# Patient Record
Sex: Female | Born: 1995 | Race: White | Hispanic: No | Marital: Single | Smoking: Never smoker
Health system: Southern US, Community
[De-identification: ages and names within clinical notes are randomized; demographics above are authoritative.]

## PROBLEM LIST (undated history)

## (undated) DIAGNOSIS — F419 Anxiety disorder, unspecified: Secondary | ICD-10-CM

## (undated) DIAGNOSIS — F32A Depression, unspecified: Secondary | ICD-10-CM

## (undated) DIAGNOSIS — Z789 Other specified health status: Secondary | ICD-10-CM

## (undated) DIAGNOSIS — F329 Major depressive disorder, single episode, unspecified: Secondary | ICD-10-CM

## (undated) DIAGNOSIS — F4310 Post-traumatic stress disorder, unspecified: Secondary | ICD-10-CM

## (undated) DIAGNOSIS — H539 Unspecified visual disturbance: Secondary | ICD-10-CM

## (undated) HISTORY — DX: Major depressive disorder, single episode, unspecified: F32.9

## (undated) HISTORY — DX: Anxiety disorder, unspecified: F41.9

## (undated) HISTORY — DX: Post-traumatic stress disorder, unspecified: F43.10

## (undated) HISTORY — DX: Depression, unspecified: F32.A

---
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
## 2016-02-19 ENCOUNTER — Encounter (HOSPITAL_COMMUNITY): Payer: Self-pay | Admitting: Psychology

## 2016-03-01 ENCOUNTER — Other Ambulatory Visit (HOSPITAL_COMMUNITY): Payer: Self-pay | Admitting: Psychiatry

## 2016-03-01 DIAGNOSIS — F429 Obsessive-compulsive disorder, unspecified: Secondary | ICD-10-CM

## 2016-04-26 ENCOUNTER — Other Ambulatory Visit (HOSPITAL_COMMUNITY): Payer: Self-pay

## 2016-04-26 DIAGNOSIS — F4310 Post-traumatic stress disorder, unspecified: Secondary | ICD-10-CM

## 2016-04-26 DIAGNOSIS — F5102 Adjustment insomnia: Secondary | ICD-10-CM

## 2016-04-26 DIAGNOSIS — F429 Obsessive-compulsive disorder, unspecified: Secondary | ICD-10-CM

## 2016-04-26 DIAGNOSIS — F4010 Social phobia, unspecified: Secondary | ICD-10-CM

## 2016-04-26 DIAGNOSIS — F332 Major depressive disorder, recurrent severe without psychotic features: Secondary | ICD-10-CM

## 2016-04-26 DIAGNOSIS — F411 Generalized anxiety disorder: Secondary | ICD-10-CM

## 2016-04-26 MED ORDER — HYDROXYZINE HCL 10 MG PO TABS: 10.0000 mg | ORAL_TABLET | Freq: Three times a day (TID) | ORAL | 2 refills | Status: DC | PRN

## 2016-04-26 MED ORDER — FLUOXETINE HCL 40 MG PO CAPS: 80.0000 mg | ORAL_CAPSULE | Freq: Every day | ORAL | 2 refills | Status: DC

## 2016-06-01 ENCOUNTER — Inpatient Hospital Stay (HOSPITAL_COMMUNITY)
Admission: AD | Admit: 2016-06-01 | Discharge: 2016-06-07 | DRG: 885 | Disposition: A | Discharge status: Home / Self Care | Payer: PRIVATE HEALTH INSURANCE | Attending: Psychiatry | Admitting: Psychiatry

## 2016-06-01 ENCOUNTER — Ambulatory Visit (INDEPENDENT_AMBULATORY_CARE_PROVIDER_SITE_OTHER): Payer: PRIVATE HEALTH INSURANCE | Admitting: Psychiatry

## 2016-06-01 ENCOUNTER — Encounter (HOSPITAL_COMMUNITY): Payer: Self-pay | Admitting: *Deleted

## 2016-06-01 ENCOUNTER — Encounter (HOSPITAL_COMMUNITY): Payer: Self-pay | Admitting: Psychiatry

## 2016-06-01 DIAGNOSIS — Z91030 Bee allergy status: Secondary | ICD-10-CM

## 2016-06-01 DIAGNOSIS — F411 Generalized anxiety disorder: Secondary | ICD-10-CM | POA: Diagnosis present

## 2016-06-01 DIAGNOSIS — F332 Major depressive disorder, recurrent severe without psychotic features: Secondary | ICD-10-CM

## 2016-06-01 DIAGNOSIS — Z9889 Other specified postprocedural states: Secondary | ICD-10-CM | POA: Diagnosis not present

## 2016-06-01 DIAGNOSIS — Z825 Family history of asthma and other chronic lower respiratory diseases: Secondary | ICD-10-CM | POA: Diagnosis not present

## 2016-06-01 DIAGNOSIS — R45851 Suicidal ideations: Secondary | ICD-10-CM | POA: Diagnosis present

## 2016-06-01 DIAGNOSIS — Z811 Family history of alcohol abuse and dependence: Secondary | ICD-10-CM

## 2016-06-01 DIAGNOSIS — F329 Major depressive disorder, single episode, unspecified: Secondary | ICD-10-CM | POA: Diagnosis present

## 2016-06-01 DIAGNOSIS — F4310 Post-traumatic stress disorder, unspecified: Secondary | ICD-10-CM

## 2016-06-01 DIAGNOSIS — F429 Obsessive-compulsive disorder, unspecified: Secondary | ICD-10-CM | POA: Diagnosis not present

## 2016-06-01 DIAGNOSIS — Z818 Family history of other mental and behavioral disorders: Secondary | ICD-10-CM | POA: Diagnosis not present

## 2016-06-01 DIAGNOSIS — Z888 Allergy status to other drugs, medicaments and biological substances status: Secondary | ICD-10-CM

## 2016-06-01 DIAGNOSIS — Z8489 Family history of other specified conditions: Secondary | ICD-10-CM

## 2016-06-01 DIAGNOSIS — Z915 Personal history of self-harm: Secondary | ICD-10-CM

## 2016-06-01 DIAGNOSIS — Z79899 Other long term (current) drug therapy: Secondary | ICD-10-CM | POA: Diagnosis not present

## 2016-06-01 LAB — CBC
HCT: 33.9 % — ABNORMAL LOW (ref 36.0–46.0)
HEMOGLOBIN: 11.9 g/dL — AB (ref 12.0–15.0)
MCH: 29.8 pg (ref 26.0–34.0)
MCHC: 35.1 g/dL (ref 30.0–36.0)
MCV: 84.8 fL (ref 78.0–100.0)
PLATELETS: 296 10*3/uL (ref 150–400)
RBC: 4 MIL/uL (ref 3.87–5.11)
RDW: 12.4 % (ref 11.5–15.5)
WBC: 7.6 10*3/uL (ref 4.0–10.5)

## 2016-06-01 LAB — COMPREHENSIVE METABOLIC PANEL
ALBUMIN: 3.9 g/dL (ref 3.5–5.0)
ALT: 13 U/L — ABNORMAL LOW (ref 14–54)
ANION GAP: 7 (ref 5–15)
AST: 14 U/L — ABNORMAL LOW (ref 15–41)
Alkaline Phosphatase: 70 U/L (ref 38–126)
BUN: 15 mg/dL (ref 6–20)
CHLORIDE: 108 mmol/L (ref 101–111)
CO2: 26 mmol/L (ref 22–32)
Calcium: 9.2 mg/dL (ref 8.9–10.3)
Creatinine, Ser: 0.69 mg/dL (ref 0.44–1.00)
GFR calc non Af Amer: >60 mL/min (ref >60)
GLUCOSE: 119 mg/dL — AB (ref 65–99)
Potassium: 3.2 mmol/L — ABNORMAL LOW (ref 3.5–5.1)
SODIUM: 141 mmol/L (ref 135–145)
Total Bilirubin: 0.5 mg/dL (ref 0.3–1.2)
Total Protein: 7.3 g/dL (ref 6.5–8.1)

## 2016-06-01 LAB — RAPID URINE DRUG SCREEN, HOSP PERFORMED
AMPHETAMINES: NOT DETECTED
BARBITURATES: NOT DETECTED
BENZODIAZEPINES: NOT DETECTED
COCAINE: NOT DETECTED
OPIATES: NOT DETECTED
TETRAHYDROCANNABINOL: NOT DETECTED

## 2016-06-01 LAB — ETHANOL: Alcohol, Ethyl (B): <5 mg/dL (ref <5)

## 2016-06-01 LAB — PREGNANCY, URINE: Preg Test, Ur: NEGATIVE

## 2016-06-01 MED ORDER — INFLUENZA VAC SPLIT QUAD 0.5 ML IM SUSY
0.5000 mL | PREFILLED_SYRINGE | INTRAMUSCULAR | Status: AC
  Administered 2016-06-02: 0.5 mL via INTRAMUSCULAR
  Filled 2016-06-01: qty 0.5

## 2016-06-01 MED ORDER — FLUOXETINE HCL 20 MG PO CAPS
80.0000 mg | ORAL_CAPSULE | Freq: Every day | ORAL | Status: DC
  Filled 2016-06-01 (×2): qty 4

## 2016-06-01 MED ORDER — MAGNESIUM HYDROXIDE 400 MG/5ML PO SUSP
30.0000 mL | Freq: Every day | ORAL | Status: DC | PRN
  Administered 2016-06-06: 30 mL via ORAL
  Filled 2016-06-01: qty 30

## 2016-06-01 MED ORDER — TRAZODONE HCL 50 MG PO TABS
50.0000 mg | ORAL_TABLET | Freq: Every evening | ORAL | Status: DC | PRN
  Administered 2016-06-01 – 2016-06-06 (×2): 50 mg via ORAL
  Filled 2016-06-01 (×2): qty 1
  Filled 2016-06-01: qty 7
  Filled 2016-06-01 (×2): qty 1

## 2016-06-01 MED ORDER — HYDROXYZINE HCL 10 MG PO TABS: 10.0000 mg | ORAL_TABLET | Freq: Three times a day (TID) | ORAL | Status: DC | PRN

## 2016-06-01 MED ORDER — VENLAFAXINE HCL ER 37.5 MG PO CP24
37.5000 mg | ORAL_CAPSULE | Freq: Every day | ORAL | Status: DC
  Administered 2016-06-02 – 2016-06-03 (×2): 37.5 mg via ORAL
  Filled 2016-06-01 (×4): qty 1

## 2016-06-01 MED ORDER — HYDROXYZINE HCL 10 MG PO TABS
10.0000 mg | ORAL_TABLET | Freq: Four times a day (QID) | ORAL | Status: DC | PRN
  Administered 2016-06-01: 10 mg via ORAL
  Filled 2016-06-01 (×2): qty 1
  Filled 2016-06-01: qty 10
  Filled 2016-06-01: qty 1

## 2016-06-01 MED ORDER — ALUM & MAG HYDROXIDE-SIMETH 200-200-20 MG/5ML PO SUSP: 30.0000 mL | ORAL | Status: DC | PRN

## 2016-06-01 MED ORDER — ACETAMINOPHEN 325 MG PO TABS: 650.0000 mg | ORAL_TABLET | Freq: Four times a day (QID) | ORAL | Status: DC | PRN

## 2016-06-01 NOTE — Tx Team (Signed)
Initial Treatment Plan 06/01/2016 12:00 PM [facility] WUJ:[id number]    PATIENT STRESSORS: Marital or family conflict Traumatic event   PATIENT STRENGTHS: Average or above average intelligence Communication skills General fund of knowledge Physical Health Supportive family/friends   PATIENT IDENTIFIED PROBLEMS: Depression  Anxiety  "I feel overwhelmed with school"  "I had a bad conversation with a friend"  Suicidal Ideation             DISCHARGE CRITERIA:  Improved stabilization in mood, thinking, and/or behavior Motivation to continue treatment in a less acute level of care Reduction of life-threatening or endangering symptoms to within safe limits Verbal commitment to aftercare and medication compliance  PRELIMINARY DISCHARGE PLAN: Outpatient therapy Return to previous living arrangement  PATIENT/FAMILY INVOLVEMENT: This treatment plan has been presented to and reviewed with the patient, Jo Haas, and/or family member.  The patient and family have been given the opportunity to ask questions and make suggestions.  Cranford MonBeaudry, Florie Carico Evans, RN 06/01/2016, 12:00 PM

## 2016-06-01 NOTE — Progress Notes (Signed)
Admission note:  Patient is a 20 yo female who was referred from outpatient treatment for depression and suicidal ideation.  Patient was seeing her psychiatrist, Dr. Landis GandyArgawal.  Patient states, "I have had depression since I was 20 years old.  I had a bad conversation with a friend last night.  It did not go well."  Patient stated that her friend did not want to see her anymore and it triggered her suicidal thoughts.  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  Patient has been compliant with her psychiatric medications.  She denies any drug or alcohol use.  She is not a smoker.  Patient does not have any pertinent medical hx.  Skin search revealed an abdominal scar from gallbladder surgery and a nose piercing.  [facility].  Patient was oriented to room and unit.

## 2016-06-01 NOTE — [facility] Suicide Risk Assessment (Signed)
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 MDD (major depressive disorder), recurrent episode, severe (HCC) [F33.2] 06/01/2016  . GAD (generalized anxiety disorder) [F41.1] 04/10/2015  . Major depressive disorder, recurrent, severe without psychotic features (HCC) [F33.2] 04/10/2015  . PTSD (post-traumatic stress disorder) [F43.10] 04/10/2015  . Social anxiety disorder [F40.10] 04/10/2015  . OCD (obsessive compulsive disorder) [F42.9] 04/10/2015  . Routine general medical examination at a health care facility [Z00.00] 04/10/2015  . Generalized anxiety disorder [F41.1] 02/08/2014  . Chronic cholecystitis with calculus [K80.10] 01/23/2014  . Major depressive disorder, recurrent episode (HCC) [F33.9] 10/27/2011     Continued Clinical Symptoms:  Alcohol Use Disorder Identification Test Final Score (AUDIT): 0 The "Alcohol Use Disorders Identification Test", Guidelines for Use in Primary Care, Second Edition.  World [employer]Health [employer] [facility](WHO). Score between 0-7:  no or low risk or alcohol related problems. Score between 8-15:  moderate risk of alcohol related problems. Score between 16-19:  high risk of alcohol related problems. Score 20 or above:  warrants further diagnostic evaluation for alcohol dependence and treatment.   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     Psychiatric Specialty Exam: Physical Exam  ROS  Blood pressure (!) 102/56, pulse 91, temperature 98.6 F (37 C), temperature source Oral, resp. rate 18, height 5\' 1"  (1.549 m), weight 135 lb 8 oz (61.5 kg), SpO2 99 %.Body mass index is 25.6 kg/m.   see admit note MSE    COGNITIVE FEATURES THAT CONTRIBUTE TO RISK:  Closed-mindedness and Loss of executive function    SUICIDE RISK:   Moderate:  Frequent suicidal ideation with limited intensity, and duration, some specificity in terms of plans, no associated intent, good self-control, limited dysphoria/symptomatology, some risk factors present, and identifiable protective factors, including available and accessible social support.   PLAN OF CARE: Patient will be admitted to inpatient psychiatric unit for stabilization and safety. Will provide and encourage milieu participation. Provide medication management and maked adjustments as needed.  Will follow daily.    I certify that inpatient services furnished can reasonably be expected to improve the patient's condition.  Nehemiah MassedOBOS, Jo Carreras, MD 06/01/2016, 5:44 PM
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
## 2016-06-01 NOTE — Progress Notes (Signed)
Recreation Therapy Notes  Animal-Assisted Activity (AAA) Program Checklist/Progress Notes Patient Eligibility Criteria Checklist & Daily Group note for Rec TxIntervention  Date: 11.07.2017 Time: 2:45pm Location: [address] Dayroom    AAA/T Program Assumption of Risk Form signed by Patient/ or Parent Legal Guardian Yes  Patient is free of allergies or sever asthma Yes  Patient reports no fear of animals Yes  Patient reports no history of cruelty to animals Yes  Patient understands his/her participation is voluntary Yes  Patient washes hands before animal contact Yes  Patient washes hands after animal contact Yes  Behavioral Response: Engaged, Attentive.   Education:Hand Washing, Appropriate Animal Interaction   Education Outcome: Acknowledges education.   Clinical Observations/Feedback: Patient attended session and interacted appropriately with therapy dog and peers. Patient asked appropriate questions about therapy dog and his training. Patient shared stories about their pets at home with group.   Salvator Seppala L Meta Kroenke, LRT/CTRS        Jo Haas L 06/01/2016 3:03 PM 

## 2016-06-01 NOTE — [facility] Group Notes (Signed)
[facility] LCSW Group Therapy 06/01/2016 1:15 PM  Type of Therapy: Group Therapy- Feelings about Diagnosis  Participation Level: Active   Participation Quality:  Reserved  Affect:  Flat  Cognitive: Alert and Oriented   Insight:  Developing   Engagement in Therapy: Developing/Improving and Engaged   Modes of Intervention: Clarification, Confrontation, Discussion, Education, Exploration, Limit-setting, Orientation, Problem-solving, Rapport Building, Dance movement psychotherapisteality Testing, Socialization and Support  Description of Group:   This group will allow patients to explore their thoughts and feelings about diagnoses they have received. Patients will be guided to explore their level of understanding and acceptance of these diagnoses. Facilitator will encourage patients to process their thoughts and feelings about the reactions of others to their diagnosis, and will guide patients in identifying ways to discuss their diagnosis with significant others in their lives. This group will be process-oriented, with patients participating in exploration of their own experiences as well as giving and receiving support and challenge from other group members.  Summary of Progress/Problems:  Pt reports that she at times feels alone in her depression due to the stigma associated with mental illness. Pt interacted well with peers and was able to process her experience with mental illness and expressed hope to find ways to cope more effectively.  Therapeutic Modalities:   Cognitive Behavioral Therapy Solution Focused Therapy Motivational Interviewing Relapse Prevention Therapy  Vernie ShanksLauren Auron Tadros, LCSW 06/01/2016 2:39 PM

## 2016-06-01 NOTE — H&P (Signed)
Psychiatric Admission Assessment Adult  Patient Identification: Jo Haas MRN:  [id number] Date of Evaluation:  06/01/2016 Chief Complaint:  " I have been feeling more depressed " Principal Diagnosis:  Major Depression, Recurrent, Severe Diagnosis:   Patient Active Problem List   Diagnosis Date Noted  . MDD (major depressive disorder), recurrent episode, severe (HCC) [F33.2] 06/01/2016  . GAD (generalized anxiety disorder) [F41.1] 04/10/2015  . Major depressive disorder, recurrent, severe without psychotic features (HCC) [F33.2] 04/10/2015  . PTSD (post-traumatic stress disorder) [F43.10] 04/10/2015  . Social anxiety disorder [F40.10] 04/10/2015  . OCD (obsessive compulsive disorder) [F42.9] 04/10/2015  . Routine general medical examination at a health care facility [Z00.00] 04/10/2015  . Generalized anxiety disorder [F41.1] 02/08/2014  . Chronic cholecystitis with calculus [K80.10] 01/23/2014  . Major depressive disorder, recurrent episode (HCC) [F33.9] 10/27/2011   History of Present Illness: 20 year old female , who has a history of chronic depression. She feels her depression has been particularly severe over recent weeks to months and describes herself as being " stuck " in her depression. States that she felt very upset about a recent conversation with a friend , where she felt " she did not care than much about what is going on with me ". After this she developed suicidal ideations, with thoughts of ingesting some chemical from the kitchen, but did not attempt to hurt self .   States that in addition to depression, has significant anxiety symptoms, but states that while mood symptoms have worsened, her anxiety symptoms have tended to slowly improve, particularly after she stopped living with her father about a year ago.  She had a scheduled appointment with her outpatient psychiatrist ( Dr. Caryl Never ), and told her about worsening mood and SI, and was encouraged to come in to  hospital. . Associated Signs/Symptoms: Depression Symptoms:  depressed mood, anhedonia, insomnia, recurrent thoughts of death, suicidal thoughts with specific plan, loss of energy/fatigue, low sense of self esteem (Hypo) Manic Symptoms:  Denies  Anxiety Symptoms:  Describes excessive worrying ,  occasional panic attacks , some agoraphobia, describes some social anxiety , but feels this has improved overtime .  Psychotic Symptoms:   Denies  PTSD Symptoms: Reports some PTSD type symptoms, to include intrusive memories, nightmares, avoidance, related to father being verbally abusive, angry, but denies physical abuse .  Total Time spent with patient: 45 minutes  Past Psychiatric History:  This is her first psychiatric admission, states one prior suicide attempt by  Overdosing on opiates - which had been prescribed 2-3 years ago because of gall bladder pain . History of self cutting, which she states was occasional, states last time was 5 years ago. Denies any history of psychosis, denies any clear history of mania or hypomania, but describes brief periods of increased energy at times, which last only minutes to hours . Stresses depression, which she describes as chronic, and anxiety, as her major symptoms. Denies any history of violence .  Is the patient at risk to self? Yes.    Has the patient been a risk to self in the past 6 months? No.  Has the patient been a risk to self within the distant past? No.  Is the patient a risk to others? No.  Has the patient been a risk to others in the past 6 months? No.  Has the patient been a risk to others within the distant past? No.   Prior Inpatient Therapy: Prior Inpatient Therapy: No Prior Outpatient Therapy: Prior Outpatient Therapy:  No Does patient have an ACCT team?: No Does patient have Intensive In-House Services?  : No Does patient have [facility] services? : No Does patient have P4CC services?: No  Alcohol Screening: 1. How often do you have  a drink containing alcohol?: Never 9. Have you or someone else been injured as a result of your drinking?: No 10. Has a relative or friend or a doctor or another health worker been concerned about your drinking or suggested you cut down?: No Alcohol Use Disorder Identification Test Final Score (AUDIT): 0 Brief Intervention: AUDIT score less than 7 or less-screening does not suggest unhealthy drinking-brief intervention not indicated Substance Abuse History in the last 12 months:  Denies alcohol abuse, denies drug abuse  Consequences of Substance Abuse: denies  Previous Psychotropic Medications:  Has been on Prozac for years, currently on 80 mgrs daily, and on Atarax PRNs. Remembers having been on Lexapro, but gained significant weight .  Psychological Evaluations:  No  Past Medical History:  Denies any medical illnesses, does not smoke , denies pregnancy .  Past Medical History:  Diagnosis Date  . Anxiety   . Depression   . Multiple body piercings    nose, ears  . PTSD (post-traumatic stress disorder)   . Vision abnormalities    corrective lenses    Past Surgical History:  Procedure Laterality Date  . LAPAROSCOPIC CHOLECYSTECTOMY SINGLE PORT N/A 03/04/2014   Procedure: LAPAROSCOPIC CHOLECYSTECTOMY SINGLE PORT;  Surgeon: Ardeth SportsmanSteven C. Gross, MD;  Location: [facility];  Service: General;  Laterality: N/A;   Family History:  Parents alive, divorced, has one brother. Currently living with mother, states he has a difficult relationship with father. Family History  Problem Relation Age of Onset  . Depression Mother   . Varicose Veins Mother   . Asthma Brother   . Alcohol abuse Father   . Anxiety disorder Father   . COPD Paternal Grandmother    Family Psychiatric  History: Mother has history of depression, father has history of anxiety, and has had psychotic decompensations in the past , states father has alcohol abuse history, denies history of suicides in family  Tobacco Screening: Have you  used any form of tobacco in the last 30 days? 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 Not employed . Denies any legal issues . History  Alcohol Use No     History  Drug Use No    Additional Social History: Marital status: Single    Pain Medications: denies Prescriptions: Prozac; Atarax Over the Counter: denies History of alcohol / drug use?: No history of alcohol / drug abuse  Allergies:   Allergies  Allergen Reactions  . Amoxicillin Swelling    .Marland Kitchen.Has patient had a PCN reaction causing immediate rash, facial/tongue/throat swelling, SOB or lightheadedness with hypotension: UNKNOWN Has patient had a PCN reaction causing severe rash involving mucus membranes or skin necrosis: UNKNOWN Has patient had a PCN reaction that required hospitalization UNKNOWN Has patient had a PCN reaction occurring within the last 10 years: Yes - infant allergy If all of the above answers are "NO", then may proceed with Cephalosporin use.   . Pollen Extract     Food containing pollen   Lab Results: No results found for this or any previous visit (from the past 48 hour(s)).  Blood Alcohol level:  No results found for: [facility]  Metabolic Disorder Labs:  No results found for: HGBA1C, MPG No results found for: PROLACTIN No results found for: CHOL, TRIG, HDL, CHOLHDL, VLDL, LDLCALC  Current Medications: Current Facility-Administered Medications  Medication Dose Route Frequency Provider Last Rate Last Dose  . acetaminophen (TYLENOL) tablet 650 mg  650 mg Oral Q6H PRN Thermon Leyland, NP      . alum & mag hydroxide-simeth (MAALOX/MYLANTA) 200-200-20 MG/5ML suspension 30 mL  30 mL Oral Q4H PRN Thermon Leyland, NP      . Melene Muller ON 06/02/2016] FLUoxetine (PROZAC) capsule 80 mg  80 mg  Oral Daily Thermon Leyland, NP      . hydrOXYzine (ATARAX/VISTARIL) tablet 10 mg  10 mg Oral TID PRN Thermon Leyland, NP      . Melene Muller ON 06/02/2016] Influenza vac split quadrivalent PF (FLUARIX) injection 0.5 mL  0.5 mL Intramuscular Tomorrow-1000 Loye Vento A Tyray Proch, MD      . magnesium hydroxide (MILK OF MAGNESIA) suspension 30 mL  30 mL Oral Daily PRN Thermon Leyland, NP      . traZODone (DESYREL) tablet 50 mg  50 mg Oral QHS PRN Thermon Leyland, NP       PTA Medications: Prescriptions Prior to Admission  Medication Sig Dispense Refill Last Dose  . FLUoxetine (PROZAC) 40 MG capsule Take 2 capsules (80 mg total) by mouth daily. 60 capsule 2 05/31/2016 at Unknown time  . hydrOXYzine (ATARAX/VISTARIL) 10 MG tablet Take 1 tablet (10 mg total) by mouth 3 (three) times daily as needed for anxiety (anxiety). 90 tablet 2 Past Month at Unknown time  . drospirenone-ethinyl estradiol (YAZ,GIANVI,LORYNA) 3-0.02 MG tablet Take 1 tablet by mouth daily. (Patient not taking: Reported on 06/01/2016) 3 Package 0 Not Taking at Unknown time    Musculoskeletal: Strength & Muscle Tone: within normal limits Gait & Station: normal Patient leans: N/A  Psychiatric Specialty Exam: Physical Exam  Review of Systems  Constitutional: Negative.   HENT: Negative.   Eyes: Negative.   Respiratory: Negative.   Cardiovascular: Negative.   Gastrointestinal: Positive for diarrhea.       Reports frequent diarrhea, especially with certain foods  Genitourinary: Negative.   Musculoskeletal: Negative.   Skin: Negative.   Neurological: Negative for seizures.  Endo/Heme/Allergies: Negative.   Psychiatric/Behavioral: Positive for depression and suicidal ideas. Negative for hallucinations and substance abuse. The patient is nervous/anxious.   All other systems reviewed and are negative.   Blood pressure (!) 102/56, pulse 91, temperature 98.6 F (37 C), temperature source Oral, resp. rate 18, height 5\' 1"  (1.549 m), weight 135 lb 8 oz  (61.5 kg), SpO2 99 %.Body mass index is 25.6 kg/m.  General Appearance: Fairly Groomed  Eye Contact:  Fair  Speech:  Normal Rate  Volume:  Decreased  Mood:  Depressed  Affect:  constricted, but reactive, and smiles briefly at times   Thought Process:  Linear  Orientation:  Full (Time, Place, and Person)  Thought Content:  denies hallucinations ,  no delusions , not internally preoccupied   Suicidal Thoughts:  No denies any current suicidal ideations or any self injurious ideations, and contracts for safety on unit   Homicidal Thoughts:  No denies any homicidal or violent ideations   Memory:  recent and remote grossly intact   Judgement:  Improving   Insight:  Fair  Psychomotor Activity:  Normal  Concentration:  Concentration: Good and Attention Span: Good  Recall:  Good  Fund of Knowledge:  Good  Language:  Good  Akathisia:  Negative  Handed:  Right  AIMS (if indicated):     Assets:  Desire for Improvement Resilience  ADL's:  Intact  Cognition:  WNL  Sleep:       Treatment Plan Summary: Daily contact with patient to assess and evaluate symptoms and progress in treatment, Medication management, Plan inpatient admission and medications as below   Observation Level/Precautions:  15 minute checks  Laboratory:  as needed   Psychotherapy:  Milieu, support, groups   Medications:  We discussed options- patient states that she has felt increasingly depressed in spite of increased Prozac dose- agrees to a new antidepressant trial - [facility].5 mgrs QAM . Trazodone and Vistaril PRN s as needed for insomnia or anxiety   Consultations:  As needed   Discharge Concerns:  -   Estimated LOS: 5 days   Other:     Physician Treatment Plan for Primary Diagnosis:  Major Depression, Recurrent, Severe, No Psychotic Features  Long Term Goal(s): Improvement in symptoms so as ready for discharge  Short Term Goals: Ability to verbalize feelings will improve, Ability to disclose and  discuss suicidal ideas and Ability to identify and develop effective coping behaviors will improve  Physician Treatment Plan for Secondary Diagnosis: Active Problems:   MDD (major depressive disorder), recurrent episode, severe (HCC)  Long Term Goal(s): Improvement in symptoms so as ready for discharge  Short Term Goals: Ability to verbalize feelings will improve, Ability to disclose and discuss suicidal ideas and Ability to identify and develop effective coping behaviors will improve  I certify that inpatient services furnished can reasonably be expected to improve the patient's condition.    Nehemiah MassedOBOS, Ellason Segar, MD 11/7/20175:07 PM

## 2016-06-01 NOTE — BH Assessment (Signed)
Assessment Note  Jo Haas is a 20 y.o. female who [facility] after an appt with her psychiatrist, Dr. Landis GandyArgawal. Pt admits to having depression "for a while" and, after a friend told her yesterday that they didn't want to see her anymore, she became more depressed and began to have SI. Pt reports a plan to OD or drink something from her kitchen. Pt intended to carry out the plan today. Pt reports being medication compliant and not having a major medication change (aside from increase of Prozac) for @ 3 years. Pt denies HI, AVH.    Diagnosis: MDD, recurrent episode, severe; GAD  Past Medical History:  Past Medical History:  Diagnosis Date  . Anxiety   . Depression   . Multiple body piercings    nose, ears  . PTSD (post-traumatic stress disorder)   . Vision abnormalities    corrective lenses    Past Surgical History:  Procedure Laterality Date  . LAPAROSCOPIC CHOLECYSTECTOMY SINGLE PORT N/A 03/04/2014   Procedure: LAPAROSCOPIC CHOLECYSTECTOMY SINGLE PORT;  Surgeon: Ardeth SportsmanSteven C. Gross, MD;  Location: [facility];  Service: General;  Laterality: N/A;    Family History:  Family History  Problem Relation Age of Onset  . Depression Mother   . Varicose Veins Mother   . Asthma Brother   . Alcohol abuse Father   . Anxiety disorder Father   . COPD Paternal Grandmother     Social History:  reports that she has never smoked. She has never used smokeless tobacco. She reports that she does not drink alcohol or use drugs.  Additional Social History:  Alcohol / Drug Use Pain Medications: denies Prescriptions: Prozac; Atarax Over the Counter: denies History of alcohol / drug use?: No history of alcohol / drug abuse  CIWA: CIWA-Ar BP: 102/65 Pulse Rate: 86 COWS:    Allergies:  Allergies  Allergen Reactions  . Amoxicillin Swelling  . Pollen Extract     Food containing pollen    Home Medications:  (Not in a hospital admission)  OB/GYN Status:  No  LMP recorded.  General Assessment Data [facility] TTS Assessment: In system Is this a Tele or Face-to-Face Assessment?: Face-to-Face Is this an Initial Assessment or a Re-assessment for this encounter?: Initial Assessment Marital status: Single Is patient pregnant?: Unknown Pregnancy Status: Unknown Living Arrangements: Parent, Other relatives Can pt return to current living arrangement?: Yes Admission Status: Voluntary Is patient capable of signing voluntary admission?: Yes Referral Source: Self/Family/Friend  Medical Screening Exam [facility] Walk[facility] ONLY) Medical Exam completed: Yes  Crisis Care Plan Living Arrangements: Parent, Other relatives Name of Psychiatrist: Dr. Landis GandyArgawal [facility]) Name of Therapist: Geoffery SpruceLeann [facility])  Education Status Is patient currently in school?: Yes Name of school: [employer]  Risk to self with the past 6 months Suicidal Ideation: Yes-Currently Present Has patient been a risk to self within the past 6 months prior to admission? : No Suicidal Intent: Yes-Currently Present Has patient had any suicidal intent within the past 6 months prior to admission? : No Is patient at risk for suicide?: Yes Suicidal Plan?: Yes-Currently Present Has patient had any suicidal plan within the past 6 months prior to admission? : No Specify Current Suicidal Plan: OD or drink something from the kitchen (i.e. bleach) Access to Means: Yes Specify Access to Suicidal Means: has medications and things from kitchen What has been your use of drugs/alcohol within the last 12 months?: pt denies Previous Attempts/Gestures: No Intentional  Self Injurious Behavior: None Family Suicide History: No Recent stressful life event(s): Other (Comment) (see narrative) Persecutory voices/beliefs?: No Depression: Yes Depression Symptoms: Feeling worthless/self pity, Loss of interest in usual pleasures, Isolating, Fatigue Substance abuse history and/or treatment for  substance abuse?: No Suicide prevention information given to non-admitted patients: Not applicable  Risk to Others within the past 6 months Homicidal Ideation: No Does patient have any lifetime risk of violence toward others beyond the six months prior to admission? : No Thoughts of Harm to Others: No Current Homicidal Intent: No Current Homicidal Plan: No Access to Homicidal Means: No History of harm to others?: No Assessment of Violence: None Noted Does patient have access to weapons?: No Criminal Charges Pending?: No Does patient have a court date: No Is patient on probation?: No  Psychosis Hallucinations: None noted Delusions: None noted  Mental Status Report Appearance/Hygiene: Unremarkable Eye Contact: Fair Motor Activity: Unremarkable Speech: Logical/coherent, Soft Level of Consciousness: Quiet/awake Mood: Depressed, Anxious Affect: Appropriate to circumstance Anxiety Level: Panic Attacks Panic attack frequency: 2 attacks per week Thought Processes: Coherent, Relevant Judgement: Impaired Orientation: Place, Person, Time, Appropriate for developmental age, Situation Obsessive Compulsive Thoughts/Behaviors: Unable to Assess  Cognitive Functioning Concentration: Normal Memory: Recent Intact, Remote Intact IQ: Average Insight: see judgement above Impulse Control: Unable to Assess Appetite: Good Sleep: No Change Total Hours of Sleep: 7 Vegetative Symptoms: Staying in bed  ADLScreening [facility] Assessment Services) Patient's cognitive ability adequate to safely complete daily activities?: Yes Patient able to express need for assistance with ADLs?: Yes Independently performs ADLs?: Yes (appropriate for developmental age)  Prior Inpatient Therapy Prior Inpatient Therapy: No  Prior Outpatient Therapy Prior Outpatient Therapy: No Does patient have an ACCT team?: No Does patient have Intensive In-House Services?  : No Does patient have [facility] services? : No Does  patient have P4CC services?: No  ADL Screening (condition at time of admission) Patient's cognitive ability adequate to safely complete daily activities?: Yes Is the patient deaf or have difficulty hearing?: No Does the patient have difficulty seeing, even when wearing glasses/contacts?: No Does the patient have difficulty concentrating, remembering, or making decisions?: No Patient able to express need for assistance with ADLs?: Yes Does the patient have difficulty dressing or bathing?: No Independently performs ADLs?: Yes (appropriate for developmental age) Does the patient have difficulty walking or climbing stairs?: No Weakness of Legs: None Weakness of Arms/Hands: None  Home Assistive Devices/Equipment Home Assistive Devices/Equipment: None  Therapy Consults (therapy consults require a physician order) PT Evaluation Needed: No OT Evalulation Needed: No SLP Evaluation Needed: No Abuse/Neglect Assessment (Assessment to be complete while patient is alone) Physical Abuse: Denies Verbal Abuse: Yes, past (Comment) Sexual Abuse: Denies Exploitation of patient/patient's resources: Denies Self-Neglect: Denies Values / Beliefs Cultural Requests During Hospitalization: None Spiritual Requests During Hospitalization: None Consults Spiritual Care Consult Needed: No Social Work Consult Needed: No Merchant navy officerAdvance Directives (For Healthcare) Does patient have an advance directive?: No Would patient like information on creating an advanced directive?: No - patient declined information    Additional Information 1:1 In Past 12 Months?: No CIRT Risk: No Elopement Risk: No Does patient have medical clearance?: Yes     Disposition:  Disposition Initial Assessment Completed for this Encounter: Yes (consulted with Fransisca KaufmannLaura Davis, PMHNP) Disposition of Patient: Inpatient treatment program Type of inpatient treatment program: Adult [facility]  On Site Evaluation by:   Reviewed with  Physician:    Laddie AquasSamantha M Kaia Depaolis 06/01/2016 10:27 AM

## 2016-06-01 NOTE — H&P (Signed)
Behavioral Health Medical Screening Exam  Jo Haas is an 20 y.o. female who [facility] due to expressing active suicidal ideation to Dr. Kenna GilbertArgarwal during his appointment this morning. Patient has history of prior overdose. Jo Haas denies having acted on her suicidal thoughts stating "I planned on drinking bleach after class this evening. Maybe taking some of my other medications too. I have been more depressed since the summer started." Patient has been accepted to [facility] for voluntary inpatient psychiatric admission.   Total Time spent with patient: 20 minutes  Psychiatric Specialty Exam: Physical Exam  Constitutional: She is oriented to person, place, and time. She appears well-developed and well-nourished.  HENT:  Head: Normocephalic and atraumatic.  Right Ear: External ear normal.  Left Ear: External ear normal.  Neck: Normal range of motion.  Cardiovascular: Normal rate, regular rhythm, normal heart sounds and intact distal pulses.   Respiratory: Effort normal and breath sounds normal.  GI: Soft. Bowel sounds are normal.  Musculoskeletal: Normal range of motion.  Neurological: She is alert and oriented to person, place, and time.  Skin: Skin is warm and dry.    Review of Systems  Constitutional: Negative for chills, fever and weight loss.  Eyes: Negative for blurred vision and double vision.  Respiratory: Negative for cough, shortness of breath and wheezing.   Cardiovascular: Negative for chest pain, palpitations and leg swelling.  Gastrointestinal: Negative for abdominal pain, diarrhea, heartburn, nausea and vomiting.  Musculoskeletal: Negative for myalgias and neck pain.  Skin: Negative for itching and rash.  Neurological: Negative for dizziness, tingling and headaches.  Psychiatric/Behavioral: Positive for depression and suicidal ideas. The patient is nervous/anxious.     Blood pressure 102/65, pulse 86, resp. rate 18.There is  no height or weight on file to calculate BMI.  General Appearance: Casual  Eye Contact:  Fair  Speech:  Clear and Coherent  Volume:  Decreased  Mood:  Dysphoric  Affect:  Constricted  Thought Process:  Coherent  Orientation:  Full (Time, Place, and Person)  Thought Content:  Worsening depression   Suicidal Thoughts:  Yes.  with intent/plan  Homicidal Thoughts:  No  Memory:  Immediate;   Good Recent;   Good Remote;   Good  Judgement:  Fair  Insight:  Present  Psychomotor Activity:  Decreased  Concentration: Concentration: Good and Attention Span: Fair  Recall:  Good  Fund of Knowledge:Good  Language: Good  Akathisia:  No  Handed:  Right  AIMS (if indicated):     Assets:  Communication Skills Desire for Improvement Financial Resources/Insurance Housing Intimacy Leisure Time Resilience Vocational/Educational  Sleep:       Musculoskeletal: Strength & Muscle Tone: within normal limits Gait & Station: normal Patient leans: N/A  Blood pressure 102/65, pulse 86, resp. rate 18.  Recommendations:  Based on my evaluation the patient does not appear to have an emergency medical condition.  Fransisca KaufmannAVIS, Deo Mehringer, NP 06/01/2016, 10:27 AM

## 2016-06-02 DIAGNOSIS — Z818 Family history of other mental and behavioral disorders: Secondary | ICD-10-CM

## 2016-06-02 DIAGNOSIS — Z811 Family history of alcohol abuse and dependence: Secondary | ICD-10-CM

## 2016-06-02 DIAGNOSIS — F332 Major depressive disorder, recurrent severe without psychotic features: Principal | ICD-10-CM

## 2016-06-02 DIAGNOSIS — Z825 Family history of asthma and other chronic lower respiratory diseases: Secondary | ICD-10-CM

## 2016-06-02 DIAGNOSIS — Z79899 Other long term (current) drug therapy: Secondary | ICD-10-CM

## 2016-06-02 LAB — TSH: TSH: 1.255 u[IU]/mL (ref 0.350–4.500)

## 2016-06-02 MED ORDER — ARIPIPRAZOLE 2 MG PO TABS
2.0000 mg | ORAL_TABLET | Freq: Every day | ORAL | Status: DC
  Administered 2016-06-02 – 2016-06-03 (×2): 2 mg via ORAL
  Filled 2016-06-02 (×4): qty 1

## 2016-06-02 MED ORDER — BACITRACIN-POLYMYXIN B 500-10000 UNIT/GM OP OINT
TOPICAL_OINTMENT | Freq: Two times a day (BID) | OPHTHALMIC | Status: DC
Start: 2016-06-02 — End: 2016-06-07
  Administered 2016-06-02 – 2016-06-05 (×2): via OPHTHALMIC
  Administered 2016-06-07: 1 via OPHTHALMIC
  Filled 2016-06-02: qty 3.5

## 2016-06-02 NOTE — [facility] Suicide Risk Assessment (Signed)
[facility]   Nursing information obtained from:    Demographic factors:    Current Mental Status:    Loss Factors:    Historical Factors:    Risk Reduction Factors:     Total Time spent with patient: 15 minutes Principal Problem: <principal problem not specified> Diagnosis:   Patient Active Problem List   Diagnosis Date Noted  . MDD (major depressive disorder), recurrent episode, severe (HCC) [F33.2] 06/01/2016  . GAD (generalized anxiety disorder) [F41.1] 04/10/2015  . Major depressive disorder, recurrent, severe without psychotic features (HCC) [F33.2] 04/10/2015  . PTSD (post-traumatic stress disorder) [F43.10] 04/10/2015  . Social anxiety disorder [F40.10] 04/10/2015  . OCD (obsessive compulsive disorder) [F42.9] 04/10/2015  . Routine general medical examination at a health care facility [Z00.00] 04/10/2015  . Generalized anxiety disorder [F41.1] 02/08/2014  . Chronic cholecystitis with calculus [K80.10] 01/23/2014  . Major depressive disorder, recurrent episode (HCC) [F33.9] 10/27/2011   Subjective Data: Patient denies any current intent or plan to act on any suicidal or homicidal ideation, plan or intent. She does relate that she at times does have chronic passive suicidal ideation. She has a remote history of self-injurious behavior but denies current self-injurious behavior thoughts or behaviors.  Continued Clinical Symptoms:  Alcohol Use Disorder Identification Test Final Score (AUDIT): 0 The "Alcohol Use Disorders Identification Test", Guidelines for Use in Primary Care, Second Edition.  World Science writerHealth Organization [facility](WHO). Score between 0-7:  no or low risk or alcohol related problems. Score between 8-15:  moderate risk of alcohol related problems. Score between 16-19:  high risk of alcohol related problems. Score 20 or above:  warrants further diagnostic evaluation for alcohol dependence and treatment.   CLINICAL FACTORS:    Dysthymia   Musculoskeletal: Strength & Muscle Tone: within normal limits Gait & Station: in bed Patient leans: N/A  Psychiatric Specialty Exam: Physical Exam  ROS  Blood pressure (!) 96/57, pulse 99, temperature 97.7 F (36.5 C), temperature source Oral, resp. rate 16, height 5\' 1"  (1.549 m), weight 61.5 kg (135 lb 8 oz), SpO2 99 %.Body mass index is 25.6 kg/m.  General Appearance: Casual  Eye Contact:  Good  Speech:  Clear and Coherent  Volume:  Decreased  Mood:  Dysphoric  Affect:  Congruent  Thought Process:  Coherent  Orientation:  Full (Time, Place, and Person)  Thought Content:  Negative  Suicidal Thoughts:  Yes.  without intent/plan  Homicidal Thoughts:  No  Memory:  Negative  Judgement:  Fair  Insight:  Fair  Psychomotor Activity:  Normal  Concentration:  Concentration: Good  Recall:  Good  Fund of Knowledge:  Good  Language:  Good  Akathisia:  No  Handed:  Right  AIMS (if indicated):     Assets:  Resilience  ADL's:  Intact  Cognition:  WNL  Sleep:  Number of Hours: 6      COGNITIVE FEATURES THAT CONTRIBUTE TO RISK:  None    SUICIDE RISK:   Mild:  Suicidal ideation of limited frequency, intensity, duration, and specificity.  There are no identifiable plans, no associated intent, mild dysphoria and related symptoms, good self-control (both objective and subjective assessment), few other risk factors, and identifiable protective factors, including available and accessible social support.   PLAN OF CARE: see PAA  I certify that inpatient services furnished can reasonably be expected to improve the patient's condition.  Acquanetta SitElizabeth Woods Oates, MD 06/02/2016, 12:25 PM

## 2016-06-02 NOTE — Progress Notes (Signed)
Recreation Therapy Notes  Date:  06/02/16 Time: 0930 Location: [address] Dayroom  Group Topic: Stress Management  Goal Area(s) Addresses:  Patient will verbalize importance of using healthy stress management.  Patient will identify positive emotions associated with healthy stress management.   Intervention: Calm App  Activity :  Forgiveness of Self Imagery.  LRT introduced the concept of guided imagery.  LRT played an imagery meditation from the Calm app so patients could engage and participate in the activity.  Patients were to follow along with the recording to participate in the activity.    Education:  Stress Management, Discharge Planning.   Education Outcome: Acknowledges edcuation/In group clarification offered/Needs additional education  Clinical Observations/Feedback: Pt did not attend group.     Hisako Bugh, LRT/CTRS         Galya Dunnigan A 06/02/2016 11:49 AM 

## 2016-06-02 NOTE — Progress Notes (Signed)
Patient ID: Jo Haas, female   DOB: 1995-12-22, 20 y.o.   MRN: [id number]  Pt currently presents with a flat affect and cooperative behavior. Pt reports to writer that their goal is to "*go to groups." Pt states "I've been sleepy today but I have been going to groups." Pt reports good sleep with current medication regimen, refuses nighttime prn med. States "I think I'll be able to sleep tonight." Pt seen interacting positively with peers.   Pt provided with medications per providers orders. Pt's labs and vitals were monitored throughout the night. Pt supported emotionally and encouraged to express concerns and questions. Pt educated on medications and side effects.   Pt's safety ensured with 15 minute and environmental checks. Pt currently denies SI/HI and A/V hallucinations. Pt verbally agrees to seek staff if SI/HI or A/VH occurs and to consult with staff before acting on any harmful thoughts. Will continue POC.

## 2016-06-02 NOTE — Progress Notes (Signed)
DAR NOTE: Pt present with flat affect and depressed mood in the unit. Pt has been isolating herself and has been in bed most of the time. Pt complained of being tired. Pt denies physical pain, took all her meds as scheduled. As per self inventory, pt had a good night sleep, good appetite, normal energy, and good concentration. Pt rate depression at 8, hopeless ness at 8, and anxiety at 8. Pt's safety ensured with 15 minute and environmental checks. Pt currently denies SI/HI and A/V hallucinations. Pt verbally agrees to seek staff if SI/HI or A/VH occurs and to consult with staff before acting on these thoughts. Will continue POC.

## 2016-06-02 NOTE — Tx Team (Signed)
Interdisciplinary Treatment and Diagnostic Plan Update  06/02/2016 Time of Session: 10:23 AM  Genecis Veley MRN: [id number]  Principal Diagnosis: Major Depression, Recurrent, Severe  Secondary Diagnoses: Active Problems:   MDD (major depressive disorder), recurrent episode, severe (HCC)   Current Medications:  Current Facility-Administered Medications  Medication Dose Route Frequency Provider Last Rate Last Dose  . acetaminophen (TYLENOL) tablet 650 mg  650 mg Oral Q6H PRN Niel Hummer, NP      . alum & mag hydroxide-simeth (MAALOX/MYLANTA) 200-200-20 MG/5ML suspension 30 mL  30 mL Oral Q4H PRN Niel Hummer, NP      . hydrOXYzine (ATARAX/VISTARIL) tablet 10 mg  10 mg Oral Q6H PRN Jenne Campus, MD   10 mg at 06/01/16 2254  . Influenza vac split quadrivalent PF (FLUARIX) injection 0.5 mL  0.5 mL Intramuscular Tomorrow-1000 Fernando A Cobos, MD      . magnesium hydroxide (MILK OF MAGNESIA) suspension 30 mL  30 mL Oral Daily PRN Niel Hummer, NP      . traZODone (DESYREL) tablet 50 mg  50 mg Oral QHS PRN Niel Hummer, NP   50 mg at 06/01/16 2254  . venlafaxine XR (EFFEXOR-XR) 24 hr capsule 37.5 mg  37.5 mg Oral Q breakfast Jenne Campus, MD   37.5 mg at 06/02/16 0808    PTA Medications: Prescriptions Prior to Admission  Medication Sig Dispense Refill Last Dose  . FLUoxetine (PROZAC) 40 MG capsule Take 2 capsules (80 mg total) by mouth daily. 60 capsule 2 05/31/2016 at Unknown time  . hydrOXYzine (ATARAX/VISTARIL) 10 MG tablet Take 1 tablet (10 mg total) by mouth 3 (three) times daily as needed for anxiety (anxiety). 90 tablet 2 Past Month at Unknown time  . drospirenone-ethinyl estradiol (YAZ,GIANVI,LORYNA) 3-0.02 MG tablet Take 1 tablet by mouth daily. (Patient not taking: Reported on 06/01/2016) 3 Package 0 Not Taking at Unknown time    Treatment Modalities: Medication Management, Group therapy, Case management,  1 to 1 session with clinician, Psychoeducation, Recreational  therapy.  Patient Stressors: Marital or family conflict Traumatic event  Patient Strengths: Average or above average intelligence Communication skills General fund of knowledge Physical Health Supportive family/friends  Physician Treatment Plan for Primary Diagnosis: Major Depression, Recurrent, Severe Long Term Goal(s): Improvement in symptoms so as ready for discharge  Short Term Goals: Ability to verbalize feelings will improve Ability to disclose and discuss suicidal ideas Ability to identify and develop effective coping behaviors will improve Ability to verbalize feelings will improve Ability to disclose and discuss suicidal ideas Ability to identify and develop effective coping behaviors will improve  Medication Management: Evaluate patient's response, side effects, and tolerance of medication regimen.  Therapeutic Interventions: 1 to 1 sessions, Unit Group sessions and Medication administration.  Evaluation of Outcomes: Not Met  Physician Treatment Plan for Secondary Diagnosis: Active Problems:   MDD (major depressive disorder), recurrent episode, severe ([place])   Long Term Goal(s): Improvement in symptoms so as ready for discharge  Short Term Goals: Ability to verbalize feelings will improve Ability to disclose and discuss suicidal ideas Ability to identify and develop effective coping behaviors will improve Ability to verbalize feelings will improve Ability to disclose and discuss suicidal ideas Ability to identify and develop effective coping behaviors will improve  Medication Management: Evaluate patient's response, side effects, and tolerance of medication regimen.  Therapeutic Interventions: 1 to 1 sessions, Unit Group sessions and Medication administration.  Evaluation of Outcomes: Not Met   RN Treatment Plan for  Primary Diagnosis: Major Depression, Recurrent, Severe Long Term Goal(s): Knowledge of disease and therapeutic regimen to maintain health will  improve  Short Term Goals: Ability to verbalize feelings will improve, Ability to disclose and discuss suicidal ideas and Ability to identify and develop effective coping behaviors will improve  Medication Management: RN will administer medications as ordered by provider, will assess and evaluate patient's response and provide education to patient for prescribed medication. RN will report any adverse and/or side effects to prescribing provider.  Therapeutic Interventions: 1 on 1 counseling sessions, Psychoeducation, Medication administration, Evaluate responses to treatment, Monitor vital signs and CBGs as ordered, Perform/monitor CIWA, COWS, AIMS and Fall Risk screenings as ordered, Perform wound care treatments as ordered.  Evaluation of Outcomes: Not Met   LCSW Treatment Plan for Primary Diagnosis: <principal problem not specified> Long Term Goal(s): Safe transition to appropriate next level of care at discharge, Engage patient in therapeutic group addressing interpersonal concerns.  Short Term Goals: Engage patient in aftercare planning with referrals and resources, Identify triggers associated with mental health/substance abuse issues and Increase skills for wellness and recovery  Therapeutic Interventions: Assess for all discharge needs, 1 to 1 time with Social worker, Explore available resources and support systems, Assess for adequacy in community support network, Educate family and significant other(s) on suicide prevention, Complete Psychosocial Assessment, Interpersonal group therapy.  Evaluation of Outcomes: Not Met   Progress in Treatment: Attending groups: Pt is new to milieu, continuing to assess  Participating in groups: Pt is new to milieu, continuing to assess  Taking medication as prescribed: Yes, MD continues to assess for medication changes as needed Toleration medication: Yes, no side effects reported at this time Family/Significant other contact made: No, CSW  assessing for appropriate contact Patient understands diagnosis: Yes AEB seeking help with depression and anxiety Discussing patient identified problems/goals with staff: Yes Medical problems stabilized or resolved: Yes Denies suicidal/homicidal ideation: Recently admitted with SI Issues/concerns per patient self-inventory: None Other: N/A  New problem(s) identified: None identified at this time.   New Short Term/Long Term Goal(s): None identified at this time.   Discharge Plan or Barriers: Pt will return home and follow-up with outpatient services.   Reason for Continuation of Hospitalization: Anxiety Depression Medication stabilization Suicidal ideation  Estimated Length of Stay: 3-5 days  Attendees: Patient: 06/02/2016  10:23 AM  Physician: Dr. Parke Poisson 06/02/2016  10:23 AM  Nursing:  06/02/2016  10:23 AM  RN Care Manager: Lars Pinks, RN 06/02/2016  10:23 AM  Social Worker: Adriana Reams, LCSW; Erasmo Downer Drinkard, LCSW 06/02/2016  10:23 AM  Recreational Therapist:  06/02/2016  10:23 AM  Other: Lindell Spar, NP; Samuel Jester, NP 06/02/2016  10:23 AM  Other:  06/02/2016  10:23 AM  Other: 06/02/2016  10:23 AM    Scribe for Treatment Team: Gladstone Lighter, LCSW 06/02/2016 10:23 AM

## 2016-06-02 NOTE — [facility] Suicide Risk Assessment (Signed)
[facility] INPATIENT:  Family/Significant Other Suicide Prevention Education  Suicide Prevention Education:  Education Completed; Jo ArmourMelissa Haas, Pt's mother [phone], has been identified by the patient as the family member/significant other with whom the patient will be residing, and identified as the person(s) who will aid the patient in the event of a mental health crisis (suicidal ideations/suicide attempt).  With written consent from the patient, the family member/significant other has been provided the following suicide prevention education, prior to the and/or following the discharge of the patient.  The suicide prevention education provided includes the following:  Suicide risk factors  Suicide prevention and interventions  National Suicide Hotline telephone number  [facility] assessment telephone number  [facility] Assistance 911  [facility] and/or [facility]idential Mobile Crisis Unit telephone number  Request made of family/significant other to:  Remove weapons (e.g., guns, rifles, knives), all items previously/currently identified as safety concern.    Remove drugs/medications (over-the-counter, prescriptions, illicit drugs), all items previously/currently identified as a safety concern.  The family member/significant other verbalizes understanding of the suicide prevention education information provided.  The family member/significant other agrees to remove the items of safety concern listed above.  Jo LennertLauren C Vineeth Haas 06/02/2016, 12:53 PM
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
## 2016-06-02 NOTE — [facility] Group Notes (Signed)
[facility] LCSW Group Therapy 06/02/2016 1:15 PM  Type of Therapy: Group Therapy- Emotion Regulation  Participation Level: Reserved  Participation Quality:  Appropriate  Affect: Appropriate  Cognitive: Alert and Oriented   Insight:  Developing/Improving  Engagement in Therapy: Developing/Improving and Engaged   Modes of Intervention: Clarification, Confrontation, Discussion, Education, Exploration, Limit-setting, Orientation, Problem-solving, Rapport Building, Dance movement psychotherapisteality Testing, Socialization and Support  Summary of Progress/Problems: The topic for group today was emotional regulation. This group focused on both positive and negative emotion identification and allowed group members to process ways to identify feelings, regulate negative emotions, and find healthy ways to manage internal/external emotions. Group members were asked to reflect on a time when their reaction to an emotion led to a negative outcome and explored how alternative responses using emotion regulation would have benefited them. Group members were also asked to discuss a time when emotion regulation was utilized when a negative emotion was experienced. Pt identified sadness as the most difficult emotion for her to regulate. She reports that it can be hard to "disengage" when she lives within a triggering environment. CSW and Pt processed ways to disengage emotionally and physically in a restrained environment that could provide space for her to calm down and process her emotions.   Jo ShanksLauren Keevan Wolz, LCSW 06/02/2016 3:30 PM

## 2016-06-02 NOTE — Accreditation Note (Signed)
Patient attend group. Her goal was a 3. Her goal try to find her purpose ;for living to think positive  and find good coping skills, she need to avoid her triggers that case sad moods swings

## 2016-06-02 NOTE — Progress Notes (Signed)
[facility] MD Progress Note  06/02/2016 3:38 PM Jo Haas  MRN:  [id number] Subjective:   Principal Problem: <principal problem not specified> Diagnosis:   Patient Active Problem List   Diagnosis Date Noted  . MDD (major depressive disorder), recurrent episode, severe ([place]) [F33.2] 06/01/2016  . GAD (generalized anxiety disorder) [F41.1] 04/10/2015  . Major depressive disorder, recurrent, severe without psychotic features ([place]) [F33.2] 04/10/2015  . PTSD (post-traumatic stress disorder) [F43.10] 04/10/2015  . Social anxiety disorder [F40.10] 04/10/2015  . OCD (obsessive compulsive disorder) [F42.9] 04/10/2015  . Routine general medical examination at a health care facility [Z00.00] 04/10/2015  . Generalized anxiety disorder [F41.1] 02/08/2014  . Chronic cholecystitis with calculus [K80.10] 01/23/2014  . Major depressive disorder, recurrent episode ([place]) [F33.9] 10/27/2011   Total Time spent with patient: 30 minutes  Past Psychiatric History: see HPI  Past Medical History:  Past Medical History:  Diagnosis Date  . Anxiety   . Depression   . Multiple body piercings    nose, ears  . PTSD (post-traumatic stress disorder)   . Vision abnormalities    corrective lenses    Past Surgical History:  Procedure Laterality Date  . LAPAROSCOPIC CHOLECYSTECTOMY SINGLE PORT N/A 03/04/2014   Procedure: LAPAROSCOPIC CHOLECYSTECTOMY SINGLE PORT;  Surgeon: Adin Hector, MD;  Location: [facility];  Service: General;  Laterality: N/A;   Family History:  Family History  Problem Relation Age of Onset  . Depression Mother   . Varicose Veins Mother   . Asthma Brother   . Alcohol abuse Father   . Anxiety disorder Father   . COPD Paternal Grandmother    Family Psychiatric  History: see HPI Social History:  History  Alcohol Use No     History  Drug Use No    Social History   Social History  . Marital status: Single    Spouse name: N/A  . Number of children: N/A  . Years of education:  N/A   Social History Main Topics  . Smoking status: Never Smoker  . Smokeless tobacco: Never Used  . Alcohol use No  . Drug use: No  . Sexual activity: No   Other Topics Concern  . None   Social History Narrative  . None   Additional Social History:    Pain Medications: denies Prescriptions: Prozac; Atarax Over the Counter: denies History of alcohol / drug use?: No history of alcohol / drug abuse                    Sleep: Good  Appetite:  Good  Current Medications: Current Facility-Administered Medications  Medication Dose Route Frequency Provider Last Rate Last Dose  . acetaminophen (TYLENOL) tablet 650 mg  650 mg Oral Q6H PRN Niel Hummer, NP      . alum & mag hydroxide-simeth (MAALOX/MYLANTA) 200-200-20 MG/5ML suspension 30 mL  30 mL Oral Q4H PRN Niel Hummer, NP      . ARIPiprazole (ABILIFY) tablet 2 mg  2 mg Oral Daily Kerrie Buffalo, NP      . bacitracin-polymyxin b (POLYSPORIN) ophthalmic ointment   Both Eyes BID Kerrie Buffalo, NP      . hydrOXYzine (ATARAX/VISTARIL) tablet 10 mg  10 mg Oral Q6H PRN Jenne Campus, MD   10 mg at 06/01/16 2254  . magnesium hydroxide (MILK OF MAGNESIA) suspension 30 mL  30 mL Oral Daily PRN Niel Hummer, NP      . traZODone (DESYREL) tablet 50 mg  50 mg Oral  QHS PRN Niel Hummer, NP   50 mg at 06/01/16 2254  . venlafaxine XR (EFFEXOR-XR) 24 hr capsule 37.5 mg  37.5 mg Oral Q breakfast Jenne Campus, MD   37.5 mg at 06/02/16 0808    Lab Results:  Results for orders placed or performed during the hospital encounter of 06/01/16 (from the past 48 hour(s))  Urine rapid drug screen (hosp performed)not at [facility]     Status: None   Collection Time: 06/01/16  2:15 PM  Result Value Ref Range   Opiates NONE DETECTED NONE DETECTED   Cocaine NONE DETECTED NONE DETECTED   Benzodiazepines NONE DETECTED NONE DETECTED   Amphetamines NONE DETECTED NONE DETECTED   Tetrahydrocannabinol NONE DETECTED NONE DETECTED   Barbiturates NONE  DETECTED NONE DETECTED    Comment:        DRUG SCREEN FOR MEDICAL PURPOSES ONLY.  IF CONFIRMATION IS NEEDED FOR ANY PURPOSE, NOTIFY LAB WITHIN 5 DAYS.        LOWEST DETECTABLE LIMITS FOR URINE DRUG SCREEN Drug Class       Cutoff (ng/mL) Amphetamine      1000 Barbiturate      200 Benzodiazepine   443 Tricyclics       154 Opiates          300 Cocaine          300 THC              50 Performed at [facility]   Pregnancy, urine     Status: None   Collection Time: 06/01/16  2:15 PM  Result Value Ref Range   Preg Test, Ur NEGATIVE NEGATIVE    Comment:        THE SENSITIVITY OF THIS METHODOLOGY IS >20 mIU/mL. Performed at [facility]   CBC     Status: Abnormal   Collection Time: 06/01/16  6:37 PM  Result Value Ref Range   WBC 7.6 4.0 - 10.5 K/uL   RBC 4.00 3.87 - 5.11 MIL/uL   Hemoglobin 11.9 (L) 12.0 - 15.0 g/dL   HCT 33.9 (L) 36.0 - 46.0 %   MCV 84.8 78.0 - 100.0 fL   MCH 29.8 26.0 - 34.0 pg   MCHC 35.1 30.0 - 36.0 g/dL   RDW 12.4 11.5 - 15.5 %   Platelets 296 150 - 400 K/uL    Comment: Performed at [facility]  Comprehensive metabolic panel     Status: Abnormal   Collection Time: 06/01/16  6:37 PM  Result Value Ref Range   Sodium 141 135 - 145 mmol/L   Potassium 3.2 (L) 3.5 - 5.1 mmol/L   Chloride 108 101 - 111 mmol/L   CO2 26 22 - 32 mmol/L   Glucose, Bld 119 (H) 65 - 99 mg/dL   BUN 15 6 - 20 mg/dL   Creatinine, Ser 0.69 0.44 - 1.00 mg/dL   Calcium 9.2 8.9 - 10.3 mg/dL   Total Protein 7.3 6.5 - 8.1 g/dL   Albumin 3.9 3.5 - 5.0 g/dL   AST 14 (L) 15 - 41 U/L   ALT 13 (L) 14 - 54 U/L   Alkaline Phosphatase 70 38 - 126 U/L   Total Bilirubin 0.5 0.3 - 1.2 mg/dL   GFR calc non Af Amer >60 >60 mL/min   GFR calc Af Amer >60 >60 mL/min    Comment: (NOTE) The eGFR has been calculated using the CKD EPI equation. This calculation has not  been validated in all clinical situations. eGFR's persistently <60 mL/min  signify possible Chronic Kidney Disease.    Anion gap 7 5 - 15    Comment: Performed at [facility]  Ethanol     Status: None   Collection Time: 06/01/16  6:37 PM  Result Value Ref Range   Alcohol, Ethyl (B) <5 <5 mg/dL    Comment:        LOWEST DETECTABLE LIMIT FOR SERUM ALCOHOL IS 5 mg/dL FOR MEDICAL PURPOSES ONLY Performed at [facility]   TSH     Status: None   Collection Time: 06/02/16  6:08 AM  Result Value Ref Range   TSH 1.255 0.350 - 4.500 uIU/mL    Comment: Performed by a 3rd Generation assay with a functional sensitivity of <=0.01 uIU/mL. Performed at [facility]     Blood Alcohol level:  Lab Results  Component Value Date   [facility] <5 80/09/4915    Metabolic Disorder Labs: No results found for: HGBA1C, MPG No results found for: PROLACTIN No results found for: CHOL, TRIG, HDL, CHOLHDL, VLDL, LDLCALC  Physical Findings: AIMS: Facial and Oral Movements Muscles of Facial Expression: None, normal Lips and Perioral Area: None, normal Jaw: None, normal Tongue: None, normal,Extremity Movements Upper (arms, wrists, hands, fingers): None, normal Lower (legs, knees, ankles, toes): None, normal, Trunk Movements Neck, shoulders, hips: None, normal, Overall Severity Severity of abnormal movements (highest score from questions above): None, normal Incapacitation due to abnormal movements: None, normal Patient's awareness of abnormal movements (rate only patient's report): No Awareness, Dental Status Current problems with teeth and/or dentures?: No Does patient usually wear dentures?: No  CIWA:    COWS:     Musculoskeletal: Strength & Muscle Tone: within normal limits Gait & Station: normal Patient leans: N/A  Psychiatric Specialty Exam: Physical Exam  Nursing note and vitals reviewed. Psychiatric: She has a normal mood and affect. Her behavior is normal. Judgment and thought content normal.    Review of  Systems  Constitutional: Negative.   HENT: Negative.   Eyes: Negative.   Respiratory: Negative.   Cardiovascular: Negative.   Gastrointestinal: Negative.   Genitourinary: Negative.   Musculoskeletal: Negative.   Skin: Negative.   Neurological: Negative.   Endo/Heme/Allergies: Negative.   Psychiatric/Behavioral: Negative for depression, hallucinations and suicidal ideas.    Blood pressure (!) 96/57, pulse 99, temperature 97.7 F (36.5 C), temperature source Oral, resp. rate 16, height _0  (1.549 m), weight 61.5 kg (135 lb 8 oz), SpO2 99 %.Body mass index is 25.6 kg/m.  General Appearance: Fairly Groomed  Eye Contact:  Good  Speech:  Clear and Coherent  Volume:  Normal  Mood:  Depressed  Affect:  Depressed  Thought Process:  Coherent  Orientation:  Full (Time, Place, and Person)  Thought Content:  Rumination  Suicidal Thoughts:  passive  Homicidal Thoughts:  No  Memory:  Immediate;   Fair Recent;   Fair Remote;   Fair  Judgement:  Fair  Insight:  Fair  Psychomotor Activity:  Normal  Concentration:  Concentration: Poor and Attention Span: Poor  Recall:  Poor  Fund of Knowledge:  Poor  Language:  Poor  Akathisia:  Negative  Handed:  Right  AIMS (if indicated):     Assets:  Housing Resilience Social Support  ADL's:  Intact  Cognition:  WNL  Sleep:  Number of Hours: 6   Treatment Plan Summary: Review of chart, vital signs, medications, and notes.  1-Individual  and group therapy  2-Medication management for depression and anxiety: Medications reviewed with the patient.  Effexor 37.5 md daily depression, Added Abilify 2 mg for mood stabilization.  EKG WNL.   3-Coping skills for depression, anxiety  4-Continue crisis stabilization and management  5-Address health issues--monitoring vital signs, stable  6-Treatment plan in progress to prevent relapse of depression and anxiety  Janett Labella, NP [facility] 06/02/2016, 3:38 PM

## 2016-06-03 LAB — LIPID PANEL
CHOL/HDL RATIO: 3.7 ratio
Cholesterol: 161 mg/dL (ref 0–200)
HDL: 44 mg/dL (ref >40)
LDL CALC: 98 mg/dL (ref 0–99)
TRIGLYCERIDES: 96 mg/dL (ref <150)
VLDL: 19 mg/dL (ref 0–40)

## 2016-06-03 MED ORDER — VENLAFAXINE HCL ER 75 MG PO CP24
75.0000 mg | ORAL_CAPSULE | Freq: Every day | ORAL | Status: DC
  Administered 2016-06-04 – 2016-06-07 (×4): 75 mg via ORAL
  Filled 2016-06-03 (×2): qty 1
  Filled 2016-06-03: qty 7
  Filled 2016-06-03 (×3): qty 1

## 2016-06-03 MED ORDER — ARIPIPRAZOLE 2 MG PO TABS
2.0000 mg | ORAL_TABLET | Freq: Every day | ORAL | Status: DC
Start: 2016-06-04 — End: 2016-06-07
  Administered 2016-06-04 – 2016-06-06 (×3): 2 mg via ORAL
  Filled 2016-06-03: qty 1
  Filled 2016-06-03: qty 7
  Filled 2016-06-03 (×3): qty 1

## 2016-06-03 MED ORDER — POTASSIUM CHLORIDE CRYS ER 10 MEQ PO TBCR
10.0000 meq | EXTENDED_RELEASE_TABLET | Freq: Two times a day (BID) | ORAL | Status: AC
  Administered 2016-06-03 – 2016-06-04 (×3): 10 meq via ORAL
  Filled 2016-06-03 (×3): qty 1

## 2016-06-03 NOTE — [facility] Group Notes (Signed)
[facility] Group Notes:  (Nursing/MHT/Case Management/Adjunct)  Date:  06/03/2016  Time:  0900 am  Type of Therapy:  Psychoeducational Skills  Participation Level:  Did Not Attend  Patient invited; declined to attend.  Cranford MonBeaudry, Yaacov Koziol Evans 06/03/2016, 10:58 AM

## 2016-06-03 NOTE — Progress Notes (Signed)
D: Patient is visible in the milieu.  She is taking her medications without any problems.  She reports she is sleeping well.  She states her depressive symptoms have decreased since admission and she "feels better."  Patient will continue care with outpatient services upon discharge.  She denies any thoughts of self harm; AVH/HI.  Her affect remains flat; she is more talkative today and less isolative. A: Continue to monitor medication management and MD orders.  Safety checks completed every 15 minutes per protocol.  Offer support and encouragement as needed. R: Patient is receptive to staff; her behavior is appropriate.

## 2016-06-03 NOTE — [facility] Group Notes (Signed)
[facility] [employer] Group Therapy 06/03/2016 1:15pm  Type of Therapy: Mental [employer] Presentation  Participation Level: Active  Participation Quality: Attentive  Affect: Appropriate  Cognitive: Oriented  Insight: Developing/Improving  Engagement in Therapy: Engaged  Modes of Intervention: Discussion, Education and Socialization  Summary of Progress/Problems: Mental [employer] Association (MHA) Speaker came to talk about his personal journey with substance abuse and addiction. The pt processed ways by which to relate to the speaker. MHA speaker provided handouts and educational information pertaining to groups and services offered by the MHA. Pt was engaged in speaker's presentation and was receptive to resources provided.    Leston Schueller, LCSW 06/03/2016 1:37 PM  

## 2016-06-03 NOTE — Progress Notes (Signed)
[facility] MD Progress Note  06/03/2016 4:47 PM Emanie Behan  MRN:  [id number] Subjective:  She states she is feeling less depressed, partially better. At this time does not endorse medication side effects. Objective : I have discussed case with treatment team and have met with patient . Remains vaguely depressed, sad, but does present with improvement compared to admission - improved eye contact, smiles at times appropriately, speech now normal in volume rather than decreased . Staff reports that patient has endorsed partial improvement, feeling " better". No disruptive or agitated behaviors on unit- going to some groups . Thus far tolerating Effexor XR trial well , no side effects. We have reviewed side effect profile , to include risk of suicidal ideations early in treatment with antidepressants in young adults and possible withdrawal .  Labs reviewed as below.  Principal Problem:  MDD Diagnosis:   Patient Active Problem List   Diagnosis Date Noted  . MDD (major depressive disorder), recurrent episode, severe ([place]) [F33.2] 06/01/2016  . GAD (generalized anxiety disorder) [F41.1] 04/10/2015  . Major depressive disorder, recurrent, severe without psychotic features ([place]) [F33.2] 04/10/2015  . PTSD (post-traumatic stress disorder) [F43.10] 04/10/2015  . Social anxiety disorder [F40.10] 04/10/2015  . OCD (obsessive compulsive disorder) [F42.9] 04/10/2015  . Routine general medical examination at a health care facility [Z00.00] 04/10/2015  . Generalized anxiety disorder [F41.1] 02/08/2014  . Chronic cholecystitis with calculus [K80.10] 01/23/2014  . Major depressive disorder, recurrent episode ([place]) [F33.9] 10/27/2011   Total Time spent with patient: 20 minutes   Past Psychiatric History: see HPI  Past Medical History:  Past Medical History:  Diagnosis Date  . Anxiety   . Depression   . Multiple body piercings    nose, ears  . PTSD (post-traumatic stress disorder)   . Vision  abnormalities    corrective lenses    Past Surgical History:  Procedure Laterality Date  . LAPAROSCOPIC CHOLECYSTECTOMY SINGLE PORT N/A 03/04/2014   Procedure: LAPAROSCOPIC CHOLECYSTECTOMY SINGLE PORT;  Surgeon: Adin Hector, MD;  Location: [facility];  Service: General;  Laterality: N/A;   Family History:  Family History  Problem Relation Age of Onset  . Depression Mother   . Varicose Veins Mother   . Asthma Brother   . Alcohol abuse Father   . Anxiety disorder Father   . COPD Paternal Grandmother    Family Psychiatric  History: see HPI Social History:  History  Alcohol Use No     History  Drug Use No    Social History   Social History  . Marital status: Single    Spouse name: N/A  . Number of children: N/A  . Years of education: N/A   Social History Main Topics  . Smoking status: Never Smoker  . Smokeless tobacco: Never Used  . Alcohol use No  . Drug use: No  . Sexual activity: No   Other Topics Concern  . None   Social History Narrative  . None   Additional Social History:    Pain Medications: denies Prescriptions: Prozac; Atarax Over the Counter: denies History of alcohol / drug use?: No history of alcohol / drug abuse  Sleep: improved   Appetite:  Good  Current Medications: Current Facility-Administered Medications  Medication Dose Route Frequency Provider Last Rate Last Dose  . acetaminophen (TYLENOL) tablet 650 mg  650 mg Oral Q6H PRN Niel Hummer, NP      . alum & mag hydroxide-simeth (MAALOX/MYLANTA) 200-200-20 MG/5ML suspension 30 mL  30 mL  Oral Q4H PRN Niel Hummer, NP      . Derrill Memo ON 06/04/2016] ARIPiprazole (ABILIFY) tablet 2 mg  2 mg Oral QHS Myer Peer Cobos, MD      . bacitracin-polymyxin b (POLYSPORIN) ophthalmic ointment   Both Eyes BID Kerrie Buffalo, NP      . hydrOXYzine (ATARAX/VISTARIL) tablet 10 mg  10 mg Oral Q6H PRN Jenne Campus, MD   10 mg at 06/01/16 2254  . magnesium hydroxide (MILK OF MAGNESIA) suspension 30 mL  30  mL Oral Daily PRN Niel Hummer, NP      . potassium chloride (K-DUR,KLOR-CON) CR tablet 10 mEq  10 mEq Oral BID Jenne Campus, MD      . traZODone (DESYREL) tablet 50 mg  50 mg Oral QHS PRN Niel Hummer, NP   50 mg at 06/01/16 2254  . [START ON 06/04/2016] venlafaxine XR (EFFEXOR-XR) 24 hr capsule 75 mg  75 mg Oral Q breakfast Jenne Campus, MD        Lab Results:  Results for orders placed or performed during the hospital encounter of 06/01/16 (from the past 48 hour(s))  CBC     Status: Abnormal   Collection Time: 06/01/16  6:37 PM  Result Value Ref Range   WBC 7.6 4.0 - 10.5 K/uL   RBC 4.00 3.87 - 5.11 MIL/uL   Hemoglobin 11.9 (L) 12.0 - 15.0 g/dL   HCT 33.9 (L) 36.0 - 46.0 %   MCV 84.8 78.0 - 100.0 fL   MCH 29.8 26.0 - 34.0 pg   MCHC 35.1 30.0 - 36.0 g/dL   RDW 12.4 11.5 - 15.5 %   Platelets 296 150 - 400 K/uL    Comment: Performed at [facility]  Comprehensive metabolic panel     Status: Abnormal   Collection Time: 06/01/16  6:37 PM  Result Value Ref Range   Sodium 141 135 - 145 mmol/L   Potassium 3.2 (L) 3.5 - 5.1 mmol/L   Chloride 108 101 - 111 mmol/L   CO2 26 22 - 32 mmol/L   Glucose, Bld 119 (H) 65 - 99 mg/dL   BUN 15 6 - 20 mg/dL   Creatinine, Ser 0.69 0.44 - 1.00 mg/dL   Calcium 9.2 8.9 - 10.3 mg/dL   Total Protein 7.3 6.5 - 8.1 g/dL   Albumin 3.9 3.5 - 5.0 g/dL   AST 14 (L) 15 - 41 U/L   ALT 13 (L) 14 - 54 U/L   Alkaline Phosphatase 70 38 - 126 U/L   Total Bilirubin 0.5 0.3 - 1.2 mg/dL   GFR calc non Af Amer >60 >60 mL/min   GFR calc Af Amer >60 >60 mL/min    Comment: (NOTE) The eGFR has been calculated using the CKD EPI equation. This calculation has not been validated in all clinical situations. eGFR's persistently <60 mL/min signify possible Chronic Kidney Disease.    Anion gap 7 5 - 15    Comment: Performed at [facility]  Ethanol     Status: None   Collection Time: 06/01/16  6:37 PM  Result Value Ref Range    Alcohol, Ethyl (B) <5 <5 mg/dL    Comment:        LOWEST DETECTABLE LIMIT FOR SERUM ALCOHOL IS 5 mg/dL FOR MEDICAL PURPOSES ONLY Performed at [facility]   TSH     Status: None   Collection Time: 06/02/16  6:08 AM  Result Value Ref  Range   TSH 1.255 0.350 - 4.500 uIU/mL    Comment: Performed by a 3rd Generation assay with a functional sensitivity of <=0.01 uIU/mL. Performed at [facility]   Lipid panel     Status: None   Collection Time: 06/03/16  6:03 AM  Result Value Ref Range   Cholesterol 161 0 - 200 mg/dL   Triglycerides 96 <150 mg/dL   HDL 44 >40 mg/dL   Total CHOL/HDL Ratio 3.7 RATIO   VLDL 19 0 - 40 mg/dL   LDL Cholesterol 98 0 - 99 mg/dL    Comment:        Total Cholesterol/HDL:CHD Risk Coronary Heart Disease Risk Table                     Men   Women  1/2 Average Risk   3.4   3.3  Average Risk       5.0   4.4  2 X Average Risk   9.6   7.1  3 X Average Risk  23.4   11.0        Use the calculated Patient Ratio above and the CHD Risk Table to determine the patient's CHD Risk.        ATP III CLASSIFICATION (LDL):  <100     mg/dL   Optimal  100-129  mg/dL   Near or Above                    Optimal  130-159  mg/dL   Borderline  160-189  mg/dL   High  >190     mg/dL   Very High Performed at [facility]     Blood Alcohol level:  Lab Results  Component Value Date   [facility] <5 86/76/1949    Metabolic Disorder Labs: No results found for: HGBA1C, MPG No results found for: PROLACTIN Lab Results  Component Value Date   CHOL 161 06/03/2016   TRIG 96 06/03/2016   HDL 44 06/03/2016   CHOLHDL 3.7 06/03/2016   VLDL 19 06/03/2016   LDLCALC 98 06/03/2016    Physical Findings: AIMS: Facial and Oral Movements Muscles of Facial Expression: None, normal Lips and Perioral Area: None, normal Jaw: None, normal Tongue: None, normal,Extremity Movements Upper (arms, wrists, hands, fingers): None, normal Lower (legs,  knees, ankles, toes): None, normal, Trunk Movements Neck, shoulders, hips: None, normal, Overall Severity Severity of abnormal movements (highest score from questions above): None, normal Incapacitation due to abnormal movements: None, normal Patient's awareness of abnormal movements (rate only patient's report): No Awareness, Dental Status Current problems with teeth and/or dentures?: No Does patient usually wear dentures?: No  CIWA:    COWS:     Musculoskeletal: Strength & Muscle Tone: within normal limits Gait & Station: normal Patient leans: N/A  Psychiatric Specialty Exam: Physical Exam  Nursing note and vitals reviewed. Psychiatric: She has a normal mood and affect. Her behavior is normal. Judgment and thought content normal.    Review of Systems  Constitutional: Negative.   HENT: Negative.   Eyes: Negative.   Respiratory: Negative.   Cardiovascular: Negative.   Gastrointestinal: Negative.   Genitourinary: Negative.   Musculoskeletal: Negative.   Skin: Negative.   Neurological: Negative.   Endo/Heme/Allergies: Negative.   Psychiatric/Behavioral: Negative for depression, hallucinations and suicidal ideas.    Blood pressure (!) 90/57, pulse (!) 109, temperature 99 F (37.2 C), temperature source Oral, resp. rate 18, height 5' 1"  (1.549 m), weight 135  lb 8 oz (61.5 kg), SpO2 99 %.Body mass index is 25.6 kg/m.  General Appearance: improved grooming   Eye Contact:  Good  Speech:  Clear and Coherent  Volume:  Normal  Mood:  Depressed, but feeling partially better   Affect:   Less constricted, reactive   Thought Process:   linear  Orientation:  Full (Time, Place, and Person)  Thought Content:  No hallucinations, no delusions, not internally preoccupied   Suicidal Thoughts:  Denies suicidal plan or intention, no self injurious ideations at this time, contracts for safety on unit   Homicidal Thoughts:  No  Memory: recent and remote grossly intact   Judgement:  Improving    Insight:  Improving   Psychomotor Activity:  Normal  Concentration:  Concentration: Good and Attention Span: Good  Recall:  Good  Fund of Knowledge:  Good  Language:  Good  Akathisia:  Negative  Handed:  Right  AIMS (if indicated):     Assets:  Housing Resilience Social Support  ADL's:  Intact  Cognition:  WNL  Sleep:  Number of Hours: 6.25    Assessment- patient gradually improving , mood still depressed, but better than on admission , affect more reactive, no active SI, more future oriented . Thus far tolerating Effexor XR trial well .   Treatment Plan Summary: Review of chart, vital signs, medications, and notes.  1-Individual and group therapy  2-Medication management for depression and anxiety: Medications reviewed with the patient. Increase  Effexor XR to 75 mgrs QDAY   For depression, Continue Abilify 2 mg  QDAY as antidepressant augmentation . Continue Vistaril 25 mgrsQ 6 hours PRN for anxiety as needed  Treatment team working on disposition planning options   Neita Garnet, MD  06/03/2016, 4:47 PM   Patient ID: Grant Fontana, female   DOB: 05-19-1996, 20 y.o.   MRN: [id number]

## 2016-06-03 NOTE — Plan of Care (Signed)
Problem: Education: Goal: Emotional status will improve Outcome: Progressing Patient reports decrease in her depressive symptoms.

## 2016-06-04 LAB — HEMOGLOBIN A1C
Hgb A1c MFr Bld: 4.9 % (ref 4.8–5.6)
Mean Plasma Glucose: 94 mg/dL

## 2016-06-04 NOTE — Progress Notes (Signed)
D: Pt denies SI/HI/AVH. Pt is pleasant and cooperative. Pt stated she had a "blah" day. Pt stated she was still endorsing depression but seems to be getting better. Pt said she was afraid she may fall back into what got her in here when she D/C. Pt appears to still be looking into who she is and what she really wants to be. Pt appears very receptive to advice and appears to want to get better.   A: Pt was offered support and encouragement. Pt was given scheduled medications. Pt was encourage to attend groups. Q 15 minute checks were done for safety.   R:Pt attends groups and interacts well with peers and staff. Pt is taking medication. Pt receptive to treatment and safety maintained on unit.

## 2016-06-04 NOTE — [facility] Group Notes (Signed)
[facility] LCSW Group Therapy 06/04/2016 1:15pm  Type of Therapy: Group Therapy- Feelings Around Relapse and Recovery  Participation Level: Active   Participation Quality:  Appropriate  Affect:  Appropriate  Cognitive: Alert and Oriented   Insight:  Developing   Engagement in Therapy: Developing/Improving and Engaged   Modes of Intervention: Clarification, Confrontation, Discussion, Education, Exploration, Limit-setting, Orientation, Problem-solving, Rapport Building, Dance movement psychotherapisteality Testing, Socialization and Support  Summary of Progress/Problems: The topic for today was feelings about relapse. The group discussed what relapse prevention is to them and identified triggers that they are on the path to relapse. Members also processed their feeling towards relapse and were able to relate to common experiences. Group also discussed coping skills that can be used for relapse prevention.  Pt was more reserved in discussion today, however was willing to participate in discussion. She expressed that she feels "dumb" for relapsing again as she has been "worse" before. CSW was able to process a more positive perspective that included complimenting herself for seeking help before her depression worsened to the point that it has in the past.    Therapeutic Modalities:   Cognitive Behavioral Therapy Solution-Focused Therapy Assertiveness Training Relapse Prevention Therapy    Damien FusiLauren Temperence Zenor, LCSW [phone] 06/04/2016 3:21 PM

## 2016-06-04 NOTE — Progress Notes (Signed)
Recreation Therapy Notes  Date: 06/04/16 Time: 0930 Location: [address] Hall Dayroom  Group Topic: Stress Management  Goal Area(s) Addresses:  Patient will verbalize importance of using healthy stress management.  Patient will identify positive emotions associated with healthy stress management.   Intervention: Stress Management  Activity :  Progressive Muscle Relaxation.  LRT introduced the stress management technique of progressive muscle relaxation.  LRT read a script to guide patients through the technique.  Patients were to follow along as LRT read script.  Education:  Stress Management, Discharge Planning.   Education Outcome: Acknowledges edcuation/In group clarification offered/Needs additional education  Clinical Observations/Feedback: Pt did not attend  group.    Caroll RancherMarjette Carlicia Leavens, LRT/CTRS         Caroll RancherLindsay, Vincenta Steffey A 06/04/2016 11:39 AM

## 2016-06-04 NOTE — [facility] Group Notes (Signed)
Patient  Attend group. His day was a 7. Her goal was not to have a  Break down. She had one yesterday. She's  trying to get through each day.

## 2016-06-04 NOTE — [facility] Group Notes (Signed)
Patient attend group. 
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
## 2016-06-04 NOTE — Progress Notes (Signed)
D: Patient appears brighter and more talkative today.  She is up in the milieu interacting with her peers.  She is not as isolative.  She rates her depression as  5; hopelessness as a 6; anxiety as a 4.  She is sleeping and eating well.  She is having a better day that yesterday and feels she is improving with treatment.  She denies any thoughts of self harm.  Patient will continue outpatient treatment with Dr. Landis GandyArgawal upon discharge. A: Continue to monitor medication management and MD orders.  Safety checks completed every 15 minutes per protocol.  Offer support and encouragement as needed. R: Patient is receptive to staff; her behavior is appropriate.

## 2016-06-04 NOTE — Plan of Care (Signed)
Problem: Activity: Goal: Interest or engagement in activities will improve Outcome: Progressing Patient is visible in the milieu today; she is attending some groups.

## 2016-06-05 DIAGNOSIS — F411 Generalized anxiety disorder: Secondary | ICD-10-CM

## 2016-06-05 NOTE — Progress Notes (Signed)
`   Jo Haas is seen OOB UAL on the 400 hall today...she tolerates this fairly well. She is quite quiet, she avoids making eye contact, she avoids attention from staff and she is visibly depressed, as evidenced by her sulky sad affect, the way she is quiet, isolative and her lack of engagement with other patients. A She did complete her daily assessment and on it she wrote she deneid SI today and she rated her depression, hopelessness and axneity " 2/1/2", respectively. She got VERY upset in [facility] group today and told this Clinical research associatewriter " it's my PTSD.. ". Pt allowed this writer to accompany her to sit in the dayroom. Writer observed pt stay in [facility] for its entirety and then pt spoke with Clinical research associatewriter, admitted that she was doing " a little " better now and that " I've had trouble with my anxiety for a long time now". R Pt more calm now. She is observed sitting straight up, her pupils are easily visible and she is breathing regularly. Pt calm and talking regularly at end of shift. POC cont.

## 2016-06-05 NOTE — [facility] Group Notes (Addendum)
Adult Group Therapy Note  Date:  06/05/2016  Time: 9:00AM-10:00AM  Group Topic/Focus: Today's group focused on the topic of fear and healthy coping skills.  An exercise was performed which elicited sources of fear that various patients feel, giving an opportunity for other patients to identify with that fear.  After a discussion of each, an unhealthy coping skill and suggestions for healthy coping skills to deal with that fear were named.  These were also listed on the whiteboard and at the end of group, the participants wanted a copy of what was on the board. Reflective listening was used to help patients connect with each other on similarities rather than to focus on differences.  Participation Level:  Active  Participation Quality:  Attentive, Sharing and Supportive  Affect:  Blunted  Cognitive:  Appropriate  Insight: Good  Engagement in Group:  Engaged  Modes of Intervention:  Activity, Discussion and Support  Additional Comments:  The patient expressed that breathing techniques are something she tries to use as a healthy coping technique.  She interacted throughout group, was very alert and interested.  Carloyn JaegerMareida J Grossman-Orr 06/05/2016 , 12:27 PM

## 2016-06-05 NOTE — Progress Notes (Signed)
Patient ID: Jo Haas, female   DOB: 20-Mar-1996, 20 y.o.   MRN: [id number]  Pt currently presents with a flat affect and cooperative, guarded behavior. Pt reports to Clinical research associatewriter that their goal is to "keep learning coping skills and figure out when I can go home." Pt states "I was triggered by something that another person said in group and started to have flashbacks but I feel better now because I found some coping skills that worked for me." Pt reports these coping skills include wrapping herself in a blanket and talking to others. Pt reports good sleep with current medication regimen.   Pt provided with medications per providers orders. Pt's labs and vitals were monitored throughout the night. Pt supported emotionally and encouraged to express concerns and questions. Pt educated on medications and raising self esteem.   Pt's safety ensured with 15 minute and environmental checks. Pt currently denies SI/HI and A/V hallucinations. Pt verbally agrees to seek staff if SI/HI or A/VH occurs and to consult with staff before acting on any harmful thoughts. Will continue POC.

## 2016-06-05 NOTE — Progress Notes (Signed)
Jo Haas had been up and visible in milieu this evening, did attend evening group activity. She was seen interacting appropriately with peers, has endorsed some on-going depression and anxiety but spoke about feeling a bit better today. She also received all bedtime medications without incident and did not verbalize any complaints of pain. A. Support and encouragement provided. R. Safety maintained, will continue to monitor.
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
## 2016-06-06 DIAGNOSIS — Z9889 Other specified postprocedural states: Secondary | ICD-10-CM

## 2016-06-06 NOTE — [facility] Group Notes (Signed)
Adult Therapy Group Note  Date:  06/06/2016 Time:  9:00-10:00AM  Group Topic/Focus:  Today's group focused on patients' feelings about being consumers of mental health services over the course of their lifetimes.  There was a healthy sharing of disappointments they have experienced, as well as what they think could be helpful to them.  Various group members spoke up with complaints and CSW attempted to address these in general terms.  Support was provided among group members and from CSW to explore ways to express their needs to mental health professionals.  Participation Level:  Active  Participation Quality:  Attentive and Sharing  Affect:  Anxious, Blunted, Depressed and Irritable  Cognitive:  Appropriate  Insight: Good  Engagement in Group:  Engaged  Modes of Intervention:  Discussion  Additional Comments: During group, patient expressed that she is particularly frustrated at the lack of response to her cries for help for 6 years in her childhood/adolescence, and feels that if she had been listened to at any point back then, she would not be in the hospital today.  She was frustrated also that people in group kept running over the top of what she was saying, and received positive feedback for daring to speak up and say that in group.    Lynnell ChadMareida J Grossman-Orr, LCSW 06/06/2016, 11:48 AM

## 2016-06-06 NOTE — Plan of Care (Signed)
Problem: Education: Goal: Utilization of techniques to improve thought processes will improve Outcome: Progressing Pt able to identify healthy coping skills to utilize during "flashback" episodes
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
## 2016-06-06 NOTE — Progress Notes (Signed)
Patient ID: Jo Haas, female   DOB: [birth year], 20 y.o.   MRN: [id number]  DAR: Pt. Denies SI/HI and A/V Hallucinations. She reports sleep is poor, appetite is good, energy level is high, and concentration is good. She rates depression 1/10, hopelessness 0/10, and anxiety 1/10. Patient does not report any pain at this time. Support and encouragement provided to the patient. Scheduled medication administered to patient however patient refused her opthalmic medication. Patient is minimal but cooperative. She reported constipation and milk of magnesia was administered. Nursing staff also encouraged patient to drink plenty of fluids and be active in movement today as that will aid in having a bowel movement. Patient verbalized understanding and a water pitcher was provided. She is seen in the milieu interacting with peers and attended morning social worker group. Q15 minute checks are maintained for safety.

## 2016-06-06 NOTE — Progress Notes (Signed)
Patient ID: Jo Haas, female   DOB: 04/28/1996, 20 y.o.   MRN: [id number]  Pt currently presents with a flat affect and cooperative behavior. Pt reports to writer that their goal is to "keep taking my medications and go to groups." Pt affect brightens during conversation. Pt reports poor sleep with medication regimen, requests as needed medication tonight.   Pt provided with medications per providers orders. Pt's labs and vitals were monitored throughout the night. Pt supported emotionally and encouraged to express concerns and questions. Pt educated on medications.  Pt's safety ensured with 15 minute and environmental checks. Pt currently denies SI/HI and A/V hallucinations. Pt verbally agrees to seek staff if SI/HI or A/VH occurs and to consult with staff before acting on any harmful thoughts. Will continue POC.

## 2016-06-06 NOTE — [facility] Group Notes (Signed)
[facility] Group Notes:  Healthy Support Systems   Date:  06/06/2016  Time:  6:44 PM  Type of Therapy:  Psychoeducational Skills  Participation Level:  Active  Participation Quality:  Appropriate  Affect:  Anxious  Cognitive:  Appropriate  Insight:  Improving  Engagement in Group:  Engaged  Modes of Intervention:  Discussion and Education  Summary of Progress/Problems: Kalie attended group and was engaged.   Marzetta BoardDopson, Jadarius Commons E 06/06/2016, 6:44 PM

## 2016-06-06 NOTE — [facility] Group Notes (Signed)
[facility] Group Notes:  (Nursing/MHT/Case Management/Adjunct)  Date:  06/05/2016  Time:  1300  Type of Therapy:  Nurse Education   /  Life Skills :  The group focuses on teaching patients how to identify their needs and how to debelop skills needed to get them met. Participation Level:  Active  Participation Quality:  Appropriate  Affect:  Depressed  Cognitive:  Oriented  Insight:  Appropriate  Engagement in Group:  Engaged  Modes of Intervention:  Education  Summary of Progress/Problems:  Jo Haas 06/06/2016, 9:25 AM

## 2016-06-07 ENCOUNTER — Inpatient Hospital Stay (HOSPITAL_COMMUNITY): Payer: PRIVATE HEALTH INSURANCE | Admitting: Licensed Clinical Social Worker

## 2016-06-07 DIAGNOSIS — F332 Major depressive disorder, recurrent severe without psychotic features: Secondary | ICD-10-CM

## 2016-06-07 DIAGNOSIS — F4310 Post-traumatic stress disorder, unspecified: Secondary | ICD-10-CM

## 2016-06-07 DIAGNOSIS — F429 Obsessive-compulsive disorder, unspecified: Secondary | ICD-10-CM

## 2016-06-07 MED ORDER — ARIPIPRAZOLE 2 MG PO TABS: 2.0000 mg | ORAL_TABLET | Freq: Every day | ORAL | 0 refills | Status: DC

## 2016-06-07 MED ORDER — VENLAFAXINE HCL ER 75 MG PO CP24: 75.0000 mg | ORAL_CAPSULE | Freq: Every day | ORAL | 0 refills | Status: DC

## 2016-06-07 MED ORDER — TRAZODONE HCL 50 MG PO TABS: 50.0000 mg | ORAL_TABLET | Freq: Every evening | ORAL | 0 refills | Status: DC | PRN

## 2016-06-07 MED ORDER — HYDROXYZINE HCL 10 MG PO TABS: 10.0000 mg | ORAL_TABLET | Freq: Four times a day (QID) | ORAL | 0 refills | Status: DC | PRN

## 2016-06-07 MED ORDER — BACITRACIN-POLYMYXIN B 500-10000 UNIT/GM OP OINT: TOPICAL_OINTMENT | Freq: Two times a day (BID) | OPHTHALMIC | 0 refills | Status: DC

## 2016-06-07 NOTE — Progress Notes (Signed)
Recreation Therapy Notes  Date: 06/07/16 Time: 0930 Location: [address] Dayroom  Group Topic: Stress Management  Goal Area(s) Addresses:  Patient will verbalize importance of using healthy stress management.  Patient will identify positive emotions associated with healthy stress management.   Intervention: Stress Management  Activity :  Peaceful Waves.  LRT introduced to the stress management technique of guided imagery.  LRT read a script to engage patients in the activity.  Patients were to follow along as LRT read script to participate in activity.  Education:  Stress Management, Discharge Planning.   Education Outcome: Acknowledges edcuation/In group clarification offered/Needs additional education  Clinical Observations/Feedback: Pt did not attend group.    Maurine Mowbray, LRT/CTRS         Brooks Kinnan A 06/07/2016 12:32 PM 

## 2016-06-07 NOTE — Discharge Summary (Signed)
Physician Discharge Summary Note  Patient:  Jo Haas is an 20 y.o., female MRN:  [id number] DOB:  Apr 01, 1996 Patient phone:  [phone] (home)  Patient address:   [address] [place] [address],  Total Time spent with patient: 45 minutes  Date of Admission:  06/01/2016 Date of Discharge: 06/07/16  Reason for Admission:   20 year old female , who has a history of chronic depression. She feels her depression has been particularly severe over recent weeks to months and describes herself as being " stuck " in her depression.States that she felt very upset about a recent conversation with a friend , where she felt " she did not care than much about what is going on with me ". After this she developed suicidal ideations, with thoughts of ingesting some chemical from the kitchen, but did not attempt to hurt self .  States that in addition to depression, has significant anxiety symptoms, but states that while mood symptoms have worsened, her anxiety symptoms have tended to slowly improve, particularly after she stopped living with her father about a year ago.  She had a scheduled appointment with her outpatient psychiatrist ( Dr. Caryl NeverAgarwall ), and told her about worsening mood and SI, and was encouraged to come in to hospital. .  Principal Problem: MDD (major depressive disorder), recurrent episode, severe [facility](HCC) Discharge Diagnoses: Patient Active Problem List   Diagnosis Date Noted  . MDD (major depressive disorder), recurrent episode, severe (HCC) [F33.2] 06/01/2016  . GAD (generalized anxiety disorder) [F41.1] 04/10/2015  . Severe episode of recurrent major depressive disorder, without psychotic features (HCC) [F33.2] 04/10/2015  . PTSD (post-traumatic stress disorder) [F43.10] 04/10/2015  . Social anxiety disorder [F40.10] 04/10/2015  . OCD (obsessive compulsive disorder) [F42.9] 04/10/2015  . Routine general medical examination at a health care facility [Z00.00] 04/10/2015   . Generalized anxiety disorder [F41.1] 02/08/2014  . Chronic cholecystitis with calculus [K80.10] 01/23/2014  . Major depressive disorder, recurrent episode (HCC) [F33.9] 10/27/2011    Past Psychiatric History: See H&P  Past Medical History:  Past Medical History:  Diagnosis Date  . Anxiety   . Depression   . Multiple body piercings    nose, ears  . PTSD (post-traumatic stress disorder)   . Vision abnormalities    corrective lenses    Past Surgical History:  Procedure Laterality Date  . LAPAROSCOPIC CHOLECYSTECTOMY SINGLE PORT N/A 03/04/2014   Procedure: LAPAROSCOPIC CHOLECYSTECTOMY SINGLE PORT;  Surgeon: Ardeth SportsmanSteven C. Gross, MD;  Location: [facility];  Service: General;  Laterality: N/A;   Family History:  Family History  Problem Relation Age of Onset  . Depression Mother   . Varicose Veins Mother   . Asthma Brother   . Alcohol abuse Father   . Anxiety disorder Father   . COPD Paternal Grandmother    Family Psychiatric  History: See H&P Social History:  History  Alcohol Use No     History  Drug Use No    Social History   Social History  . Marital status: Single    Spouse name: N/A  . Number of children: N/A  . Years of education: N/A   Social History Main Topics  . Smoking status: Never Smoker  . Smokeless tobacco: Never Used  . Alcohol use No  . Drug use: No  . Sexual activity: No   Other Topics Concern  . None   Social History Narrative  . None    Hospital Course:   [facility] was [facility] for MDD (  major depressive disorder), recurrent episode, severe (HCC), and crisis management.  Pt was treated discharged with the medications listed below under Medication List.  Medical problems were identified and treated as needed.  Home medications were restarted as appropriate.  Improvement was monitored by observation and Jo Haas 's daily report of symptom reduction.  Emotional and mental status was monitored by daily self-inventory reports completed  by [facility] and clinical staff.         [facility] was evaluated by the treatment team for stability and plans for continued recovery upon discharge. [address] Rathgeber 's motivation was an integral factor for scheduling further treatment. Employment, transportation, bed availability, health status, family support, and any pending legal issues were also considered during hospital stay. Pt was offered further treatment options upon discharge including but not limited to Residential, Intensive Outpatient, and Outpatient treatment.  [facility] will follow up with the services as listed below under Follow Up Information.     Upon completion of this admission the patient was both mentally and medically stable for discharge denying suicidal/homicidal ideation, auditory/visual/tactile hallucinations, delusional thoughts and paranoia.    [facility] responded well to treatment with abilify, polytrim, vistaril, trazodone, effexor without adverse effects. Pt demonstrated improvement without reported or observed adverse effects to the point of stability appropriate for outpatient management. Pertinent labs include: K+ 3.2, for which outpatient follow-up is necessary for lab recheck as mentioned below. Reviewed CBC, CMP, BAL, and UDS; all unremarkable aside from noted exceptions.   Physical Findings: AIMS: Facial and Oral Movements Muscles of Facial Expression: None, normal Lips and Perioral Area: None, normal Jaw: None, normal Tongue: None, normal,Extremity Movements Upper (arms, wrists, hands, fingers): None, normal Lower (legs, knees, ankles, toes): None, normal, Trunk Movements Neck, shoulders, hips: None, normal, Overall Severity Severity of abnormal movements (highest score from questions above): None, normal Incapacitation due to abnormal movements: None, normal Patient's awareness of abnormal movements (rate only patient's report): No Awareness, Dental Status Current problems  with teeth and/or dentures?: No Does patient usually wear dentures?: No  CIWA:    COWS:     Musculoskeletal: Strength & Muscle Tone: within normal limits Gait & Station: normal Patient leans: N/A  Psychiatric Specialty Exam: Physical Exam  Review of Systems  Psychiatric/Behavioral: Positive for depression. Negative for hallucinations and suicidal ideas. The patient is nervous/anxious and has insomnia.   All other systems reviewed and are negative.   Blood pressure 97/71, pulse (!) 107, temperature 98.1 F (36.7 C), temperature source Oral, resp. rate 16, height 5\' 1"  (1.549 m), weight 61.5 kg (135 lb 8 oz), SpO2 99 %.Body mass index is 25.6 kg/m.  SEE MD PSE WITHIN THE SRA  Have you used any form of tobacco in the last 30 days? (Cigarettes, Smokeless Tobacco, Cigars, and/or Pipes): No  Has this patient used any form of tobacco in the last 30 days? (Cigarettes, Smokeless Tobacco, Cigars, and/or Pipes) Yes, No  Blood Alcohol level:  Lab Results  Component Value Date   ETH <5 06/01/2016    Metabolic Disorder Labs:  Lab Results  Component Value Date   HGBA1C 4.9 06/03/2016   MPG 94 06/03/2016   No results found for: PROLACTIN Lab Results  Component Value Date   CHOL 161 06/03/2016   TRIG 96 06/03/2016   HDL 44 06/03/2016   CHOLHDL 3.7 06/03/2016   VLDL 19 06/03/2016   LDLCALC 98 06/03/2016    See Psychiatric Specialty Exam and Suicide Risk Assessment completed by Attending Physician  prior to discharge.  Discharge destination:  Home  Is patient on multiple antipsychotic therapies at discharge:  No   Has Patient had three or more failed trials of antipsychotic monotherapy by history:  No  Recommended Plan for Multiple Antipsychotic Therapies: NA     Medication List    STOP taking these medications   drospirenone-ethinyl estradiol 3-0.02 MG tablet Commonly known as:  YAZ,GIANVI,LORYNA   FLUoxetine 40 MG capsule Commonly known as:  PROZAC     TAKE these  medications     Indication  ARIPiprazole 2 MG tablet Commonly known as:  ABILIFY Take 1 tablet (2 mg total) by mouth at bedtime.  Indication:  mood stabilization   bacitracin-polymyxin b ophthalmic ointment Commonly known as:  POLYSPORIN Place into both eyes 2 (two) times daily. apply to eye every 12 hours while awake (for 3 more days ending 06/11/16.  Indication:  Conjunctivitis   hydrOXYzine 10 MG tablet Commonly known as:  ATARAX/VISTARIL Take 1 tablet (10 mg total) by mouth every 6 (six) hours as needed for anxiety. What changed:  when to take this  reasons to take this  Indication:  Anxiety Neurosis   traZODone 50 MG tablet Commonly known as:  DESYREL Take 1 tablet (50 mg total) by mouth at bedtime as needed for sleep.  Indication:  Trouble Sleeping   venlafaxine XR 75 MG 24 hr capsule Commonly known as:  EFFEXOR-XR Take 1 capsule (75 mg total) by mouth daily with breakfast. Start taking on:  06/08/2016  Indication:  Major Depressive Disorder      Follow-up Information    BEHAVIORAL HEALTH PARTIAL HOSPITALIZATION PROGRAM Follow up on 06/07/2016.   Specialty:  Behavioral Health Why:  Please go straight from the inpatient unit to your initial assessment for the partial hospitalization program at 2:30pm. They will also help you schedule your appointments with Leann and Dr. Selinda MichaelsArgawahl. Contact information: [address] [id number] mc [address] [place] [phone]          Follow-up recommendations:  Activity:  As tolerated Diet:  Heart healthy with low sodium.  Comments:   Take all medications as prescribed. Keep all follow-up appointments as scheduled.  Do not consume alcohol or use illegal drugs while on prescription medications. Report any adverse effects from your medications to your primary care provider promptly.  In the event of recurrent symptoms or worsening symptoms, call 911, a crisis hotline, or go to the nearest emergency  department for evaluation.    Signed: Beau FannyWithrow, John C, FNP 06/07/2016, 10:58 AM  Patient seen, Suicide Assessment Completed.  Disposition Plan Reviewed

## 2016-06-07 NOTE — BHH Suicide Risk Assessment (Signed)
[facility]   Principal Problem: MDD (major depressive disorder), recurrent episode, severe (HCC) Discharge Diagnoses:  Patient Active Problem List   Diagnosis Date Noted  . MDD (major depressive disorder), recurrent episode, severe (HCC) [F33.2] 06/01/2016  . GAD (generalized anxiety disorder) [F41.1] 04/10/2015  . Severe episode of recurrent major depressive disorder, without psychotic features (HCC) [F33.2] 04/10/2015  . PTSD (post-traumatic stress disorder) [F43.10] 04/10/2015  . Social anxiety disorder [F40.10] 04/10/2015  . OCD (obsessive compulsive disorder) [F42.9] 04/10/2015  . Routine general medical examination at a health care facility [Z00.00] 04/10/2015  . Generalized anxiety disorder [F41.1] 02/08/2014  . Chronic cholecystitis with calculus [K80.10] 01/23/2014  . Major depressive disorder, recurrent episode (HCC) [F33.9] 10/27/2011    Total Time spent with patient: 30 minutes  Musculoskeletal: Strength & Muscle Tone: within normal limits Gait & Station: normal Patient leans: N/A  Psychiatric Specialty Exam: ROS  Blood pressure 97/71, pulse 97, temperature 98.1 F (36.7 C), temperature source Oral, resp. rate 16, height 5\' 1"  (1.549 m), weight 135 lb 8 oz (61.5 kg), SpO2 99 %.Body mass index is 25.6 kg/m.  General Appearance: Well Groomed  Eye Contact::  Good  Speech:  Normal Rate409  Volume:  Normal  Mood:  improving mood, feels less depressed, states " I am feeling better, and less anxious"  Affect:  Appropriate and more reactive   Thought Process:  Linear  Orientation:  Full (Time, Place, and Person)  Thought Content:  denies hallucinations, no delusions , not internally preoccupied   Suicidal Thoughts:  No- denies any suicidal ideations, no self injurious ideations   Homicidal Thoughts:  No- denies any homicidal or violent ideations   Memory:  recent and remote grossly intact   Judgement:  Other:  improved   Insight:  improving   Psychomotor Activity:  Normal  Concentration:  Good  Recall:  Good  Fund of Knowledge:Good  Language: Negative  Akathisia:  Negative  Handed:  Right  AIMS (if indicated):     Assets:  Communication Skills Desire for Improvement Resilience  Sleep:  Number of Hours: 6.75  Cognition: WNL  ADL's:  Intact   Mental Status Per Nursing Assessment::   On Admission:     Demographic Factors:  20 year old single female, lives with mother and brother, in college   Loss Factors: Relationship stressors, poor relationship with father  Historical Factors: This is her first psychiatric admission, history of self cutting, history of one prior suicide attemt  Risk Reduction Factors:   Sense of responsibility to family, Living with another person, especially a relative, Positive social support and Positive coping skills or problem solving skills  Continued Clinical Symptoms:  At this time patient is much improved compared to admission - presents alert,attentive, well related, calm, mood is improved and denies significant depression at this time , affect is fuller in range, no thought disorder, denies suicidal or self injurious ideations, no hallucinations, no delusions, not internally preoccupied, future oriented. At this time denies medication side effects, which we have reviewed . We have reviewed risk of increased suicidal ideations early in treatment with antidepressants in young adults and risk of WDL symptoms  if stopping Venlafaxine suddenly   Cognitive Features That Contribute To Risk:  No gross cognitive deficits noted upon discharge. Is alert , attentive, and oriented x 3   Suicide Risk:  Mild:  Suicidal ideation of limited frequency, intensity, duration, and specificity.  There are no identifiable plans, no associated intent, mild dysphoria  and related symptoms, good self-control (both objective and subjective assessment), few other risk factors, and identifiable protective factors,  including available and accessible social support.  Follow-up Information    BEHAVIORAL HEALTH PARTIAL HOSPITALIZATION PROGRAM Follow up on 06/07/2016.   Specialty:  Behavioral Health Why:  Please go straight from the inpatient unit to your initial assessment for the partial hospitalization program at 2:30pm. They will also help you schedule your appointments with Leann and Dr. Selinda MichaelsArgawahl. Contact information: [address] [id number] [address]c [address] [place] [phone]          Plan Of Care/Follow-up recommendations:  Activity:  as tolerated Diet:  Regular  Tests:  Regular Other:  NA Patient is leaving in good spirits Plans to return to live with mother Plans to follow up as above   Nehemiah MassedOBOS, Jacoby Zanni, MD 06/07/2016, 11:53 AM

## 2016-06-07 NOTE — BHH Group Notes (Signed)
Pt did not attended group. 

## 2016-06-07 NOTE — Progress Notes (Signed)
  [facility] :  Will you be returning to the same living situation after discharge:  Yes,  Pt returning home At discharge, do you have transportation home?: Yes,  Pt mother to pick up Do you have the ability to pay for your medications: Yes,  Pt provided with prescriptions  Release of information consent forms completed and in the chart;  Patient's signature needed at discharge.  Patient to Follow up at: Follow-up Information    BEHAVIORAL HEALTH PARTIAL HOSPITALIZATION PROGRAM Follow up on 06/07/2016.   Specialty:  Behavioral Health Why:  Please go straight from the inpatient unit to your initial assessment for the partial hospitalization program at 2:30pm. They will also help you schedule your appointments with Leann and Dr. Selinda MichaelsArgawahl. Contact information: [address] [id number] mc [address] [place] [phone]          Next level of care provider has access to [facility] Link:yes  Safety Planning and Suicide Prevention discussed: Yes,  with mother; see SPE note  Have you used any form of tobacco in the last 30 days? (Cigarettes, Smokeless Tobacco, Cigars, and/or Pipes): No  Has patient been referred to the Quitline?: N/A patient is not a smoker  Patient has been referred for addiction treatment: N/A  Verdene LennertLauren C Dominik Lauricella 06/07/2016, 9:01 AM

## 2016-06-07 NOTE — Tx Team (Signed)
Interdisciplinary Treatment and Diagnostic Plan Update  06/07/2016 Time of Session: 9:00 AM  Jo Haas MRN: [id number]  Principal Diagnosis: Major Depression, Recurrent, Severe  Secondary Diagnoses: Principal Problem:   MDD (major depressive disorder), recurrent episode, severe (HCC)   Current Medications:  Current Facility-Administered Medications  Medication Dose Route Frequency Provider Last Rate Last Dose  . acetaminophen (TYLENOL) tablet 650 mg  650 mg Oral Q6H PRN Thermon LeylandLaura A Davis, NP      . alum & mag hydroxide-simeth (MAALOX/MYLANTA) 200-200-20 MG/5ML suspension 30 mL  30 mL Oral Q4H PRN Thermon LeylandLaura A Davis, NP      . ARIPiprazole (ABILIFY) tablet 2 mg  2 mg Oral QHS Craige CottaFernando A Cobos, MD   2 mg at 06/06/16 2117  . bacitracin-polymyxin b (POLYSPORIN) ophthalmic ointment   Both Eyes BID Adonis BrookSheila Agustin, NP   1 application at 06/07/16 0806  . hydrOXYzine (ATARAX/VISTARIL) tablet 10 mg  10 mg Oral Q6H PRN Craige CottaFernando A Cobos, MD   10 mg at 06/01/16 2254  . magnesium hydroxide (MILK OF MAGNESIA) suspension 30 mL  30 mL Oral Daily PRN Thermon LeylandLaura A Davis, NP   30 mL at 06/06/16 0827  . traZODone (DESYREL) tablet 50 mg  50 mg Oral QHS PRN Thermon LeylandLaura A Davis, NP   50 mg at 06/06/16 2115  . venlafaxine XR (EFFEXOR-XR) 24 hr capsule 75 mg  75 mg Oral Q breakfast Craige CottaFernando A Cobos, MD   75 mg at 06/07/16 0805    PTA Medications: Prescriptions Prior to Admission  Medication Sig Dispense Refill Last Dose  . FLUoxetine (PROZAC) 40 MG capsule Take 2 capsules (80 mg total) by mouth daily. 60 capsule 2 05/31/2016 at Unknown time  . hydrOXYzine (ATARAX/VISTARIL) 10 MG tablet Take 1 tablet (10 mg total) by mouth 3 (three) times daily as needed for anxiety (anxiety). 90 tablet 2 Past Month at Unknown time  . drospirenone-ethinyl estradiol (YAZ,GIANVI,LORYNA) 3-0.02 MG tablet Take 1 tablet by mouth daily. (Patient not taking: Reported on 06/01/2016) 3 Package 0 Not Taking at Unknown time    Treatment Modalities:  Medication Management, Group therapy, Case management,  1 to 1 session with clinician, Psychoeducation, Recreational therapy.  Patient Stressors: Marital or family conflict Traumatic event  Patient Strengths: Average or above average intelligence Communication skills General fund of knowledge Physical Health Supportive family/friends  Physician Treatment Plan for Primary Diagnosis: Major Depression, Recurrent, Severe Long Term Goal(s): Improvement in symptoms so as ready for discharge  Short Term Goals: Ability to verbalize feelings will improve Ability to disclose and discuss suicidal ideas Ability to identify and develop effective coping behaviors will improve Ability to verbalize feelings will improve Ability to disclose and discuss suicidal ideas Ability to identify and develop effective coping behaviors will improve  Medication Management: Evaluate patient's response, side effects, and tolerance of medication regimen.  Therapeutic Interventions: 1 to 1 sessions, Unit Group sessions and Medication administration.  Evaluation of Outcomes: Adequate for Discharge  Physician Treatment Plan for Secondary Diagnosis: Principal Problem:   MDD (major depressive disorder), recurrent episode, severe (HCC)   Long Term Goal(s): Improvement in symptoms so as ready for discharge  Short Term Goals: Ability to verbalize feelings will improve Ability to disclose and discuss suicidal ideas Ability to identify and develop effective coping behaviors will improve Ability to verbalize feelings will improve Ability to disclose and discuss suicidal ideas Ability to identify and develop effective coping behaviors will improve  Medication Management: Evaluate patient's response, side effects, and tolerance of medication regimen.  Therapeutic Interventions: 1 to 1 sessions, Unit Group sessions and Medication administration.  Evaluation of Outcomes: Adequate for Discharge   RN Treatment Plan  for Primary Diagnosis: Major Depression, Recurrent, Severe Long Term Goal(s): Knowledge of disease and therapeutic regimen to maintain health will improve  Short Term Goals: Ability to verbalize feelings will improve, Ability to disclose and discuss suicidal ideas and Ability to identify and develop effective coping behaviors will improve  Medication Management: RN will administer medications as ordered by provider, will assess and evaluate patient's response and provide education to patient for prescribed medication. RN will report any adverse and/or side effects to prescribing provider.  Therapeutic Interventions: 1 on 1 counseling sessions, Psychoeducation, Medication administration, Evaluate responses to treatment, Monitor vital signs and CBGs as ordered, Perform/monitor CIWA, COWS, AIMS and Fall Risk screenings as ordered, Perform wound care treatments as ordered.  Evaluation of Outcomes: Adequate for Discharge   LCSW Treatment Plan for Primary Diagnosis: MDD (major depressive disorder), recurrent episode, severe (HCC) Long Term Goal(s): Safe transition to appropriate next level of care at discharge, Engage patient in therapeutic group addressing interpersonal concerns.  Short Term Goals: Engage patient in aftercare planning with referrals and resources, Identify triggers associated with mental health/substance abuse issues and Increase skills for wellness and recovery  Therapeutic Interventions: Assess for all discharge needs, 1 to 1 time with Social worker, Explore available resources and support systems, Assess for adequacy in community support network, Educate family and significant other(s) on suicide prevention, Complete Psychosocial Assessment, Interpersonal group therapy.  Evaluation of Outcomes: Adequate for Discharge   Progress in Treatment: Attending groups: Yes  Participating in groups: Yes Taking medication as prescribed: Yes, MD continues to assess for medication changes  as needed Toleration medication: Yes, no side effects reported at this time Family/Significant other contact made: Yes with mother Patient understands diagnosis: Yes AEB seeking help with depression and anxiety Discussing patient identified problems/goals with staff: Yes Medical problems stabilized or resolved: Yes Denies suicidal/homicidal ideation: Yes Issues/concerns per patient self-inventory: None Other: N/A  New problem(s) identified: None identified at this time.   New Short Term/Long Term Goal(s): None identified at this time.   Discharge Plan or Barriers: Pt will return home and follow-up with outpatient services.   Reason for Continuation of Hospitalization: Anxiety Depression Medication stabilization Suicidal ideation  Estimated Length of Stay: 0 days; Pt stable for DC  Attendees: Patient: 06/07/2016  9:00 AM  Physician: Dr. Jama Flavorsobos 06/07/2016  9:00 AM  Nursing: Quintella ReichertBeverly Knight, RN; Marzetta Boardhrista Dopson, RN 06/07/2016  9:00 AM  RN Care Manager: Onnie BoerJennifer Clark, RN 06/07/2016  9:00 AM  Social Worker: Vernie ShanksLauren Zyeir Dymek, LCSW; Kristin Drinkard, LCSW 06/07/2016  9:00 AM  Recreational Therapist:  06/07/2016  9:00 AM  Other: Hillery Jacksanika Lewis, NP; Claudette Headonrad Withrow, NP 06/07/2016  9:00 AM  Other:  06/07/2016  9:00 AM  Other: 06/07/2016  9:00 AM    Scribe for Treatment Team: Verdene LennertLauren C Jaquil Todt, LCSW 06/07/2016 9:00 AM

## 2016-06-07 NOTE — Progress Notes (Signed)
[facility] Group Notes:  (Nursing/MHT/Case Management/Adjunct)  Date:  06/07/2016  Time:  12:20 AM  Type of Therapy:  Psychoeducational Skills  Participation Level:  Active  Participation Quality:  Appropriate  Affect:  Appropriate  Cognitive:  Appropriate  Insight:  Appropriate  Engagement in Group:  Engaged  Modes of Intervention:  Education  Summary of Progress/Problems: Patient states that she had a "great day" overall and that she enjoyed playing "gestures" with her peers. In addition, she mentioned that she enjoyed socializing with her peers on the unit as well. In terms of the theme of the day, her support system will consist of her mother.    Bryauna Byrum S 06/07/2016, 12:20 AM

## 2016-06-07 NOTE — Progress Notes (Signed)
Patient ID: Jo Haas, female   DOB: 12-29-1995, 20 y.o.   MRN: [id number]  Pt. Denies SI/HI and A/V hallucinations. Belongings returned to patient at time of discharge. Patient denies any pain or discomfort. Discharge instructions and medications were reviewed with patient. Patient verbalized understanding of both medications and discharge instructions. [facility]. Writer made sure patient was checked in before discharging patient to lobby. Q15 minute safety checks until time of discharge. No distress upon discharge.
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
## 2016-06-09 ENCOUNTER — Other Ambulatory Visit (HOSPITAL_COMMUNITY): Payer: PRIVATE HEALTH INSURANCE | Attending: Psychiatry | Admitting: Licensed Clinical Social Worker

## 2016-06-09 DIAGNOSIS — F428 Other obsessive-compulsive disorder: Secondary | ICD-10-CM | POA: Diagnosis not present

## 2016-06-09 DIAGNOSIS — F332 Major depressive disorder, recurrent severe without psychotic features: Secondary | ICD-10-CM

## 2016-06-09 DIAGNOSIS — Z79899 Other long term (current) drug therapy: Secondary | ICD-10-CM | POA: Diagnosis not present

## 2016-06-09 DIAGNOSIS — F411 Generalized anxiety disorder: Secondary | ICD-10-CM | POA: Insufficient documentation

## 2016-06-09 DIAGNOSIS — Z915 Personal history of self-harm: Secondary | ICD-10-CM | POA: Insufficient documentation

## 2016-06-09 DIAGNOSIS — F339 Major depressive disorder, recurrent, unspecified: Secondary | ICD-10-CM | POA: Diagnosis present

## 2016-06-09 DIAGNOSIS — F4310 Post-traumatic stress disorder, unspecified: Secondary | ICD-10-CM

## 2016-06-09 DIAGNOSIS — F429 Obsessive-compulsive disorder, unspecified: Secondary | ICD-10-CM
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
## 2016-06-10 ENCOUNTER — Other Ambulatory Visit (HOSPITAL_COMMUNITY): Payer: PRIVATE HEALTH INSURANCE | Admitting: Specialist

## 2016-06-10 ENCOUNTER — Other Ambulatory Visit (HOSPITAL_COMMUNITY): Payer: PRIVATE HEALTH INSURANCE | Admitting: Licensed Clinical Social Worker

## 2016-06-10 DIAGNOSIS — F332 Major depressive disorder, recurrent severe without psychotic features: Secondary | ICD-10-CM

## 2016-06-10 DIAGNOSIS — F429 Obsessive-compulsive disorder, unspecified: Secondary | ICD-10-CM

## 2016-06-10 DIAGNOSIS — F4310 Post-traumatic stress disorder, unspecified: Secondary | ICD-10-CM

## 2016-06-10 DIAGNOSIS — F411 Generalized anxiety disorder: Secondary | ICD-10-CM

## 2016-06-10 NOTE — Therapy (Signed)
[facility] [address] [place], [place], [address] Phone: [phone]   Fax:  [phone]  Occupational Therapy Evaluation  Patient Details  Name: Jo MinusMadison Dedeaux MRN: [id number] Date of Birth: 06-20-1996 No Data Recorded  Encounter Date: 06/10/2016      OT End of Session - 06/10/16 1443    Visit Number 1   Number of Visits 6   Date for OT Re-Evaluation 07/10/16   Authorization Type [facility]   Authorization Time Period no auth required   OT Start Time 1030   OT Stop Time 1145   OT Time Calculation (min) 75 min   Activity Tolerance Patient tolerated treatment well   Behavior During Therapy [facility] for tasks assessed/performed      Past Medical History:  Diagnosis Date  . Anxiety   . Depression   . Multiple body piercings    nose, ears  . PTSD (post-traumatic stress disorder)   . Vision abnormalities    corrective lenses    Past Surgical History:  Procedure Laterality Date  . LAPAROSCOPIC CHOLECYSTECTOMY SINGLE PORT N/A 03/04/2014   Procedure: LAPAROSCOPIC CHOLECYSTECTOMY SINGLE PORT;  Surgeon: Ardeth SportsmanSteven C. Gross, MD;  Location: [facility];  Service: General;  Laterality: N/A;    There were no vitals filed for this visit.      Subjective Assessment - 06/10/16 1442    Currently in Pain? 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  General Causality Orientation Scale    Subscore Percentile Score  Autonomy 47 31.30  Control 22 .85  Impersonal 55 76.63   Motivation Type  Motivation type Explanation  o Impersonal/amotivational There is a lack of connection between any of the individuals behavior and his or her personal goals.  The individual is likely in a passivity state or manifests non-goal-directed behavior.  This is considered a type of motivational deficit and warrants motivational interventions.  Assessment:  Patient demonstrates impersona/a motivational motivation type.  Patient will benefit from occupational therapy intervention in order to improve  Motivation, time management, financial management, stress management, job readiness skills, social skills,sleep hygiene, exercise and healthy eating habits,  and health management skills and other psychosocial skills needed for preparation to return to full time community living and to be a productive community member.   Plan:  Patient will participate in skilled occupational therapy sessions individually or in a group setting to improve coping skills, psychosocial skills, and emotional skills required to return to prior level of function as a productive community member. Treatment will be 1-2 times per week for 2-6 weeks.         OT Education - 06/10/16 1442    Education provided Yes   Education Details Reviewed time management strategies and issued homework to prioritize daily activities in order to build schedule   Person(s) Educated Patient   Methods Explanation;Handout   Comprehension Verbalized understanding          OT Short  Term Goals - 06/10/16 1444      OT SHORT TERM GOAL #1   Title Patient will be educated on strategies to improve psychosocial skills needed to participate fully in all daily, work, and leisure activities.   Time 3   Period Weeks   Status New     OT SHORT TERM GOAL #2   Title Patient will be educated on a HEP and independent with implementation of HEP.   Time 3    Period Weeks   Status New     OT SHORT TERM GOAL #3   Title Patient will independently apply psychosocial skills and coping mechanisms to her daily actiivties in order to function independently.   Time 3   Period Weeks   Status New         S:  I tend to make a weekly schedule in my head.  I used to have a hard time even getting motivated to shower, so making a schedule helps me stay on task. O:  Patient actively participated in the following skilled occupational therapy group this date: o Time management - definition, effects of good vs poor time management, varying levels of structure for scheduling.  Patient required minimal facilitation from occupational therapist to remain focused and engaged in group. A:  Patient participated in skilled occupational therapy group for time management skills this date.  Patient was quiet, however offered some comments.  P:  Continue participation in skilled occupational therapy groups  1-2 times per week for 3 weeks in order to gain the necessary skills needed to return to full time community living and learn effective coping strategies to be a productive community resident.             Plan - 06/10/16 1444    OT Frequency 2x / week   OT Duration 4 weeks   OT Treatment/Interventions Self-care/ADL training;Patient/family education;Other (comment)  pyschosocial skills, coping skills      Patient will benefit from skilled therapeutic intervention in order to improve the following deficits and impairments:     Visit Diagnosis: Major depressive disorder, recurrent, severe without psychotic features (HCC)  Generalized anxiety disorder    Problem List Patient Active Problem List   Diagnosis Date Noted  . MDD (major depressive disorder), recurrent episode, severe (HCC) 06/01/2016  . GAD (generalized anxiety disorder) 04/10/2015  . Severe episode of recurrent major depressive disorder, without psychotic features (HCC) 04/10/2015  . PTSD  (post-traumatic stress disorder) 04/10/2015  . Social anxiety disorder 04/10/2015  . OCD (obsessive compulsive disorder) 04/10/2015  . Routine general medical examination at a health care facility 04/10/2015  . Generalized anxiety disorder 02/08/2014  . Chronic cholecystitis with calculus 01/23/2014  . Major depressive disorder, recurrent episode (HCC) 10/27/2011    Shirlean MylarBethany H. Murray, MHA, OTR/L [phone]  06/10/2016, 2:47 PM  [facility] [address] [place], [place], [address] Phone: [phone]   Fax:  [phone]  Name: Jo Haas MRN: [id number] Date of Birth: January 19, 1996

## 2016-06-10 NOTE — Psych (Signed)
   [facility]ER[facility]  Jo Haas [id number]  Session Time: 9 AM - 1 PM  Participation Level: Active  Behavioral Response: CasualAlertDepressed  Type of Therapy: Group Therapy  Treatment Goals addressed: Coping; Depression  Interventions: CBT, DBT, Supportive and Reframing  Summary: Clinician facilitated check-in regarding current stressors and situation, and review of patient completed diary card. Clinician utilized active listening and empathetic response and validated patient emotions. Clinician facilitated discussion on offering ourselves grace, honoring  feelings, and finding joy. Clinician continued topic of cognitive distortions. Clinician introduced topic of happiness and what it entails. Clinician assessed for immediate needs, medication compliance and efficacy, and safety concerns.   Suicidal/Homicidal: Nowithout intent/plan  Therapist Response: Jo Haas is a 20 y.o. female who presents with depression, anxiety, and trauma symptoms. Patient arrived within time allowed and reports she is feeling "good." Patient rates her mood at a 7 on a 1- 10 scale with 10 being great. Patient engaged in group activity and discussion. Patient reports understanding of topics discussed. Patient demonstrates some progress as evidenced by continued desire to work on issues and engage in group. Patient denies SI/HI/self-harm thoughts   Plan: Patient will continue in PHP and medication management. Work towards decreasing depression symptoms and increase emotional regulation and positive coping skills.    Diagnosis: Severe episode of recurrent major depressive disorder, without psychotic features (HCC) [F33.2]    1. Severe episode of recurrent major depressive disorder, without psychotic features (HCC)   2. Obsessive-compulsive disorder, unspecified type   3. PTSD (post-traumatic stress disorder)       Donia GuilesJenny Edie Darley, LCSW 06/10/2016

## 2016-06-10 NOTE — Psych (Signed)
   [facility]  Jo Haas [id number]  Session Time: 9 AM - 2 PM  Participation Level: Active  Behavioral Response: CasualAlertDepressed  Type of Therapy: Group Therapy  Treatment Goals addressed: Coping; Depression  Interventions: CBT, DBT, Supportive and Reframing  Summary: Clinician facilitated check-in regarding current stressors and situation, and review of patient completed diary card. Clinician utilized active listening and empathetic response and validated patient emotions. Clinician facilitated discussion on relationships and self esteem. Clinician allowed patients to discussed pertinent topics during a process session. Clinician introduced cognitive distortions. Patient provided examples of common cognitive distortions she experiences. Clinician and patient discussed methods of combating cognitive distortions including "check the facts" and using the "best friend test." Clinician reviewed Emotional/Rational/Wise mind. Clinician assessed for immediate needs, medication compliance and efficacy, and safety concerns.   Suicidal/Homicidal: Nowithout intent/plan  Therapist Response: Jo Haas is a 20 y.o. female who presents with depression, anxiety, and trauma symptoms. Patient arrived within time allowed and reports she is feeling "good." Patient rates her mood at a 7 on a 1- 10 scale with 10 being great. Patient engaged in group activity and discussion. Patient provided specific examples of ways to combat common cognitive distortions. Patient demonstrates some progress as evidenced by participation and openness in group. This is patient's first session. Patient denies SI/HI/self-harm thoughts   Plan: Patient will continue in PHP and medication management. Work towards decreasing depression symptoms and increase emotional regulation and positive coping skills.    Diagnosis: Severe episode of recurrent major depressive disorder, without psychotic  features (HCC) [F33.2]    1. Severe episode of recurrent major depressive disorder, without psychotic features (HCC)   2. Obsessive-compulsive disorder, unspecified type   3. PTSD (post-traumatic stress disorder)       Donia GuilesJenny Masen Luallen, LCSW 06/10/2016

## 2016-06-11 ENCOUNTER — Other Ambulatory Visit (HOSPITAL_COMMUNITY): Payer: PRIVATE HEALTH INSURANCE

## 2016-06-14 ENCOUNTER — Other Ambulatory Visit (HOSPITAL_COMMUNITY): Payer: PRIVATE HEALTH INSURANCE | Admitting: Licensed Clinical Social Worker

## 2016-06-14 DIAGNOSIS — F411 Generalized anxiety disorder: Secondary | ICD-10-CM

## 2016-06-14 DIAGNOSIS — F332 Major depressive disorder, recurrent severe without psychotic features: Secondary | ICD-10-CM

## 2016-06-15 ENCOUNTER — Ambulatory Visit (HOSPITAL_COMMUNITY): Payer: Self-pay

## 2016-06-15 ENCOUNTER — Other Ambulatory Visit (HOSPITAL_COMMUNITY): Payer: PRIVATE HEALTH INSURANCE

## 2016-06-16 ENCOUNTER — Other Ambulatory Visit (HOSPITAL_COMMUNITY): Payer: PRIVATE HEALTH INSURANCE | Admitting: Licensed Clinical Social Worker

## 2016-06-16 DIAGNOSIS — F429 Obsessive-compulsive disorder, unspecified: Secondary | ICD-10-CM

## 2016-06-16 DIAGNOSIS — F4310 Post-traumatic stress disorder, unspecified: Secondary | ICD-10-CM

## 2016-06-16 DIAGNOSIS — F332 Major depressive disorder, recurrent severe without psychotic features: Secondary | ICD-10-CM

## 2016-06-16 NOTE — Psych (Signed)
   [facility]  Jo Haas [id number]  Session Time: 9 AM - 2 PM  Participation Level: Minimal  Behavioral Response: CasualAlertDepressed  Type of Therapy: Group Therapy  Treatment Goals addressed: Coping; Depression  Interventions: CBT, DBT, Supportive and Reframing  Summary: Clinician facilitated check-in regarding current stressors and situation, and review of patient completed diary card. Clinician utilized active listening and empathetic response and validated patient emotions. Clinician discussed how patient was feeling physically and what patient wanted to do. Clinician assessed for immediate needs, and SI/HI. Cln asked pt to call if she was not feeling better by tomorrow.   Suicidal/Homicidal: Nowithout intent/plan  Therapist Response: Jo Haas is a 20 y.o. female who presents with depression, anxiety, and trauma symptoms. Patient presented within time allowed and in a "down" mood. Patient rated herself at a "5" out of 10 at the beginning of group due to feeling sick. Patient states having a productive weekend and denies conflict or interests coming up. Patient reports she thinks she has a "bug or cold, hopefully not the flu" and decided it would be best for her to go home and rest. Pt left approximately 2 hours into group. Patient denies SI/HI/self-harm thoughts   Plan: Patient will continue in PHP and medication management. Work towards decreasing depression symptoms and increase emotional regulation and positive coping skills.    Diagnosis: Major depressive disorder, recurrent, severe without psychotic features (HCC) [F33.2]    1. Major depressive disorder, recurrent, severe without psychotic features (HCC)   2. Generalized anxiety disorder       Donia GuilesJenny Thresia Ramanathan, LCSW 06/16/2016

## 2016-06-16 NOTE — Psych (Signed)
   [facility]  Jo Haas [id number]  Session Time: 9 AM - 2 PM  Participation Level: Minimal  Behavioral Response: CasualAlertDepressed  Type of Therapy: Group Therapy  Treatment Goals addressed: Coping; Depression  Interventions: CBT, DBT, Supportive and Reframing  Summary: Clinician facilitated check-in regarding current stressors and situation, and review of patient completed diary card. Clinician utilized active listening and empathetic response and validated patient emotions. Clinician facilitated discussion on motivation and goal setting. Clinician introduced topic positive psychology and ways to "re-train" your brain for positivity. Clinician led gratitude activity and group participated in coping skill bingo. Clinician assessed for immediate needs, and SI/HI. Cln asked pt to call if she was not feeling better by tomorrow.   Suicidal/Homicidal: Nowithout intent/plan  Therapist Response: Jo Haas is a 20 y.o. female who presents with depression, anxiety, and trauma symptoms. Patient presented within time allowed and in a "okay" mood. Patient rated herself at a "5" out of 10 at the beginning of group due to continued lingering sickness. Patient engaged in discussion and activity. Patient was able to share one baby step to take towards a goal. Patient  reports understanding of topic and named gratitudes and coping skills to utilize on holiday break.  Patient demonstrates some progress as evidenced by openness with her struggles and connecting with multiple ideas to try at home. Patient denies SI/HI/self-harm thoughts   Plan: Patient will continue in PHP and medication management. Work towards decreasing depression symptoms and increase emotional regulation and positive coping skills.    Diagnosis: Major depressive disorder, recurrent, severe without psychotic features (HCC) [F33.2]    1. Major depressive disorder, recurrent, severe without  psychotic features (HCC)   2. PTSD (post-traumatic stress disorder)   3. Obsessive-compulsive disorder, unspecified type       Donia GuilesJenny Starlett Pehrson, LCSW 06/16/2016

## 2016-06-21 ENCOUNTER — Other Ambulatory Visit (HOSPITAL_COMMUNITY): Payer: PRIVATE HEALTH INSURANCE | Admitting: Licensed Clinical Social Worker

## 2016-06-21 DIAGNOSIS — F4310 Post-traumatic stress disorder, unspecified: Secondary | ICD-10-CM

## 2016-06-21 DIAGNOSIS — F332 Major depressive disorder, recurrent severe without psychotic features: Secondary | ICD-10-CM | POA: Diagnosis not present

## 2016-06-21 DIAGNOSIS — F429 Obsessive-compulsive disorder, unspecified: Secondary | ICD-10-CM

## 2016-06-22 ENCOUNTER — Encounter (HOSPITAL_COMMUNITY): Payer: Self-pay | Admitting: Occupational Therapy

## 2016-06-22 ENCOUNTER — Other Ambulatory Visit (HOSPITAL_COMMUNITY): Payer: PRIVATE HEALTH INSURANCE | Admitting: Licensed Clinical Social Worker

## 2016-06-22 ENCOUNTER — Other Ambulatory Visit (HOSPITAL_COMMUNITY): Payer: PRIVATE HEALTH INSURANCE | Attending: Psychiatry | Admitting: Occupational Therapy

## 2016-06-22 DIAGNOSIS — F332 Major depressive disorder, recurrent severe without psychotic features: Secondary | ICD-10-CM

## 2016-06-22 DIAGNOSIS — F429 Obsessive-compulsive disorder, unspecified: Secondary | ICD-10-CM

## 2016-06-22 DIAGNOSIS — F411 Generalized anxiety disorder: Secondary | ICD-10-CM

## 2016-06-22 DIAGNOSIS — F4310 Post-traumatic stress disorder, unspecified: Secondary | ICD-10-CM

## 2016-06-22 NOTE — Therapy (Signed)
[facility] PROGRAM [address] [place], [place], [address] Phone: [phone]   Fax:  [phone]  Occupational Therapy Treatment  Patient Details  Name: Jo Haas MRN: [id number] Date of Birth: August 06, 1995 No Data Recorded  Encounter Date: 06/22/2016      OT End of Session - 06/22/16 1241    Visit Number 2   Number of Visits 6   Date for OT Re-Evaluation 07/10/16   Authorization Type [employer] [facility]   Authorization Time Period no auth required   OT Start Time 1030   OT Stop Time 1130   OT Time Calculation (min) 60 min   Activity Tolerance Patient tolerated treatment well   Behavior [facility] Therapy [facility] for tasks assessed/performed      Past Medical History:  Diagnosis Date  . Anxiety   . Depression   . Multiple body piercings    nose, ears  . PTSD (post-traumatic stress disorder)   . Vision abnormalities    corrective lenses    Past Surgical History:  Procedure Laterality Date  . LAPAROSCOPIC CHOLECYSTECTOMY SINGLE PORT N/A 03/04/2014   Procedure: LAPAROSCOPIC CHOLECYSTECTOMY SINGLE PORT;  Surgeon: Ardeth SportsmanSteven C. Gross, MD;  Location: [facility] ORS;  Service: General;  Laterality: N/A;    There were no vitals filed for this visit.      Subjective Assessment - 06/22/16 1241    Currently in Pain? No/denies        OT Treatment Group   S:  "I think I sleep ok."  O:  Patient actively participated in the following skilled occupational therapy treatment session this date: Sleep hygiene - discussed ideal amount of sleep one needs, Jo Haas's current sleep schedule and ideal schedule. Jo Haas currently get approximately 7 hours of sleep per night, however it can be inconsistent. Discussed reasons current sleep schedule is not ideal for her social and school needs and strategies that may be utilized to decrease the amount of time he sleeps and increase her activity and accountability level.  Discussed developing a  routine for leading up to going to bed to promote falling asleep quickly and remaining asleep.  Patient remained engaged and focused during session. Jo Haas participated in group discussion of factors that interfere with sleep and lifestyle factors promoting sleep. Also discussed moving to a different environment if trying to remain awake.  A:  Patient participated in skilled occupational therapy group for sleep hygiene skills this date.  Patient was engaged and appears open to strategies introduced.  Patient is getting a varied amount of sleep each night, including daily naps ranging in length and time of day.  Discussed strategies to make environment less conducive to sleep during daylight hours, managing diet, and developing an exercise schedule.  P:  Continue participation in skilled occupational therapy groups  1-2 times per week in order to gain the necessary skills needed to return to full time community living and learn effective coping strategies to be a productive community resident. Follow up on HEP for developing a evening/night-time routine and finding a daily exercise outlet of moderate intensity.          OT Short Term Goals - 06/22/16 1242      OT SHORT TERM GOAL #1   Title Patient will be educated on strategies to improve psychosocial skills needed to participate fully in all daily, work, and leisure activities.   Time 3   Period Weeks   Status On-going     OT SHORT TERM GOAL #2  Title Patient will be educated on a HEP and independent with implementation of HEP.   Time 3   Period Weeks   Status On-going     OT SHORT TERM GOAL #3   Title Patient will independently apply psychosocial skills and coping mechanisms to her daily actiivties in order to function independently.   Time 3   Period Weeks   Status On-going                  Plan - 06/22/16 1241    Rehab Potential Good   OT Frequency --  1-2x/week   OT Duration --  3 weeks   OT  Treatment/Interventions Self-care/ADL training;Patient/family education  coping skills development training, psychosocial skill development, community reintegration   Consulted and Agree with Plan of Care Patient      Patient will benefit from skilled therapeutic intervention in order to improve the following deficits and impairments:   (decreased coping skills, decreased psychosocial skills)  Visit Diagnosis: Major depressive disorder, recurrent, severe without psychotic features (HCC)  Generalized anxiety disorder    Problem List Patient Active Problem List   Diagnosis Date Noted  . MDD (major depressive disorder), recurrent episode, severe (HCC) 06/01/2016  . GAD (generalized anxiety disorder) 04/10/2015  . Severe episode of recurrent major depressive disorder, without psychotic features (HCC) 04/10/2015  . PTSD (post-traumatic stress disorder) 04/10/2015  . Social anxiety disorder 04/10/2015  . OCD (obsessive compulsive disorder) 04/10/2015  . Routine general medical examination at a health care facility 04/10/2015  . Generalized anxiety disorder 02/08/2014  . Chronic cholecystitis with calculus 01/23/2014  . Major depressive disorder, recurrent episode (HCC) 10/27/2011   Ezra SitesLeslie Kamarri Lovvorn, OTR/L  [phone] 06/22/2016, 12:42 PM  [facility] PROGRAM [address] [place], [place], [address] Phone: [phone]   Fax:  [phone]  Name: Jo Haas MRN: [id number] Date of Birth: April 17, 1996

## 2016-06-22 NOTE — Psych (Signed)
   [facility]  Jo MinusMadison Haas [id number]  Session Time: 9 AM - 2 PM  Participation Level: Active  Behavioral Response: CasualAlertDepressed  Type of Therapy: Group Therapy  Treatment Goals addressed: Coping; Depression   Interventions: CBT, DBT, Supportive and Reframing  Summary: Clinician facilitated check-in regarding current stressors and situation, and review of patient completed diary card. Clinician utilized active listening and empathetic response and validated patient emotions. Clinician facilitated discussion on the holiday break and how it went. Group discussed ways to increase socialability and positive activities into their every day. Clinician introduced topic of friendships and self esteem. Clinician assessed for immediate needs, and SI/HI. Cln asked pt to call if she was not feeling better by tomorrow.   Suicidal/Homicidal: Nowithout intent/plan  Therapist Response: Jo Haas is a 20 y.o. female who presents with depression, anxiety, and trauma symptoms. Patient presented within time allowed and in a "okay" mood. Patient rated herself at a "5" out of 10 with 1 being low and 10 being great. Patient reports she continues to feel physically sick and that is bringing her down. Patient engaged in discussion and activity. Patient shared experiences from the break and how she managed. Patient brainstormed ways to reach out and join new social groups. Patient verbalized ways in which her self esteem effected her relationships. Patient demonstrates some progress as evidenced by honesty regarding her situation over break and stating how she self-managed to return to a manageable level. Patient denies SI/HI/self-harm thoughts   Plan: Patient will continue in PHP and medication management. Work towards decreasing depression symptoms and increase emotional regulation and positive coping skills.    Diagnosis: Major depressive disorder, recurrent, severe  without psychotic features (HCC) [F33.2]    1. Major depressive disorder, recurrent, severe without psychotic features (HCC)   2. Obsessive-compulsive disorder, unspecified type   3. PTSD (post-traumatic stress disorder)       Donia GuilesJenny Mylin Gignac, LCSW 06/22/2016

## 2016-06-23 ENCOUNTER — Other Ambulatory Visit (HOSPITAL_COMMUNITY): Payer: PRIVATE HEALTH INSURANCE | Admitting: Licensed Clinical Social Worker

## 2016-06-23 DIAGNOSIS — F332 Major depressive disorder, recurrent severe without psychotic features: Secondary | ICD-10-CM | POA: Diagnosis not present

## 2016-06-23 NOTE — Psych (Signed)
   [facility]  Jo Haas [id number]  Session Time: 9 AM - 2 PM  Participation Level: Active  Behavioral Response: CasualAlertDepressed  Type of Therapy: Group Therapy  Treatment Goals addressed: Coping; Depression   Interventions: CBT, DBT, Supportive and Reframing  Summary: Clinician facilitated check-in regarding current stressors and situation, and review of patient completed diary card. Clinician utilized active listening and empathetic response and validated patient emotions. Clinician facilitated discussion on managing anxiety symptoms and relationships. Clinician led skill review of distress tolerance skills, mindfulness, radical acceptance, and cognitive check skills. Clinician assessed for immediate needs, and SI/HI. Cln asked pt to call if she was not feeling better by tomorrow.   Suicidal/Homicidal: Nowithout intent/plan  Therapist Response: Jo Haas is a 20 y.o. female who presents with depression, anxiety, and trauma symptoms. Patient presented within time allowed and in a "good" mood. Patient rated herself at a "7" out of 10 with 1 being low and 10 being great. Patient participated in skill review and reported understanding of how and when to use skills.  Patient demonstrates some progress as evidenced by reporting concrete steps she will take to manage relationships. Patient denies SI/HI/self-harm thoughts   Plan: Patient will continue in PHP and medication management. Work towards decreasing depression symptoms and increase emotional regulation and positive coping skills.    Diagnosis: Major depressive disorder, recurrent, severe without psychotic features (HCC) [F33.2]    1. Major depressive disorder, recurrent, severe without psychotic features (HCC)   2. Obsessive-compulsive disorder, unspecified type   3. PTSD (post-traumatic stress disorder)       Donia GuilesJenny Bettyjane Shenoy, LCSW 06/23/2016

## 2016-06-23 NOTE — Progress Notes (Signed)
[name] says she has been depressed all her life, remembering having suicidal thought even when she was 6.  No reason to be depressed but is.  She has suicidal ideation a lot and has made many suicidal attempts.  Her recent inpatient just before this partial hospital admission was very helpful.  Medications have also helped recently.  She is no longer suicidal but is still depressed but hopeful for the first time in a long time that things might change.  The group is helpful she says and currently more helpful than one to one. Plan:  Continue current medications and group therapy

## 2016-06-24 ENCOUNTER — Other Ambulatory Visit (HOSPITAL_COMMUNITY): Payer: PRIVATE HEALTH INSURANCE | Admitting: Occupational Therapy

## 2016-06-24 ENCOUNTER — Other Ambulatory Visit (HOSPITAL_COMMUNITY): Payer: PRIVATE HEALTH INSURANCE | Admitting: Licensed Clinical Social Worker

## 2016-06-24 ENCOUNTER — Encounter (HOSPITAL_COMMUNITY): Payer: Self-pay | Admitting: Occupational Therapy

## 2016-06-24 DIAGNOSIS — F332 Major depressive disorder, recurrent severe without psychotic features: Secondary | ICD-10-CM

## 2016-06-24 DIAGNOSIS — F411 Generalized anxiety disorder: Secondary | ICD-10-CM

## 2016-06-24 DIAGNOSIS — F429 Obsessive-compulsive disorder, unspecified: Secondary | ICD-10-CM

## 2016-06-24 DIAGNOSIS — F4310 Post-traumatic stress disorder, unspecified: Secondary | ICD-10-CM

## 2016-06-24 NOTE — Psych (Signed)
   [facility]  Jo MinusMadison Haas [id number]  Session Time: 9 AM - 2 PM  Participation Level: Active  Behavioral Response: CasualAlertDepressed  Type of Therapy: Group Therapy  Treatment Goals addressed: Coping; Depression   Interventions: CBT, DBT, Supportive and Reframing  Summary: Clinician facilitated check-in regarding current stressors and situation, and review of patient completed diary card. Clinician utilized active listening and empathetic response and validated patient emotions.Clinician facilitated discussion on energy, fear, and honesty. Clinician introduced topic of needs. Group completed first half of needs assessment and discussed ways to meet needs which could be improved upon. Clinician assessed for immediate needs, and SI/HI. Cln asked pt to call if she was not feeling better by tomorrow.   Suicidal/Homicidal: Nowithout intent/plan  Therapist Response: Jo Haas is a 20 y.o. female who presents with depression, anxiety, and trauma symptoms. Patient presented within time allowed and in a "good" mood. Patient rated herself at a "7" out of 10 with 1 being low and 10 being great. Patient reports she continues to be very tired and low energy. Patient engaged in discussion. Patient participated in needs assessment and identified building trust, increase hobbies, and working towards future goals as area which she would like to improve. Patient stated ways in which she can increase meeting his needs in those areas.  Patient demonstrates some progress as evidenced by verbalizing her needs and stating some radical acceptance ideas. Patient denies SI/HI/self-harm thoughts   Plan: Patient will continue in PHP and medication management. Work towards decreasing depression symptoms and increase emotional regulation and positive coping skills.    Diagnosis: Severe recurrent major depression without psychotic features (HCC) [F33.2]    1. Severe recurrent  major depression without psychotic features [facility](HCC)       Jo GuilesJenny Rollyn Scialdone, LCSW 06/24/2016

## 2016-06-24 NOTE — Therapy (Signed)
[facility] [address] [place], [place], [address] Phone: [phone]   Fax:  [phone]  Occupational Therapy Treatment  Patient Details  Name: Jo Haas MRN: [id number] Date of Birth: 1995/12/10 No Data Recorded  Encounter Date: 06/24/2016      OT End of Session - 06/24/16 1243    Visit Number 3   Number of Visits 6   Date for OT Re-Evaluation 07/10/16   Authorization Type [employer]   Authorization Time Period no auth required   OT Start Time 1030   OT Stop Time 1135   OT Time Calculation (min) 65 min   Activity Tolerance Patient tolerated treatment well   Behavior [facility] for tasks assessed/performed      Past Medical History:  Diagnosis Date  . Anxiety   . Depression   . Multiple body piercings    nose, ears  . PTSD (post-traumatic stress disorder)   . Vision abnormalities    corrective lenses    Past Surgical History:  Procedure Laterality Date  . LAPAROSCOPIC CHOLECYSTECTOMY SINGLE PORT N/A 03/04/2014   Procedure: LAPAROSCOPIC CHOLECYSTECTOMY SINGLE PORT;  Surgeon: Ardeth SportsmanSteven C. Gross, MD;  Location: [facility];  Service: General;  Laterality: N/A;    There were no vitals filed for this visit.      Subjective Assessment - 06/24/16 1243    Currently in Pain? No/denies      OT Group: Social and Manufacturing systems engineerCommunication Skills   S:  "It's hard to think of positive traits."  O:  Patient actively participated in the following skilled occupational therapy treatment session this date:             Social and communication skills: Maddie participated in discussion of social and communication skills including the importance of social skills, what she is good at and what she needs to improve on. Maddie engaged in discussion on conversation starters identifying more than 5 potential ideas for beginning a conversation. Also engaged in discussion on body language and what is portrayed via  various gestures and postures. Maddie completed self-esteem alphabet worksheet identifying good qualities about herself.  Also discussed variables that affect self-esteem. Finally engaged in discussion of assertiveness including confident behavior and reviewed the traits of passive, assertive, and aggressive individuals.   A:  Patient participated in skilled occupational therapy group for social and communication skills this date.  Patient was minimally engaged and open to ideas and strategies introduced.  Pt feels minimally confident in her communication skills, also feels she has low self-esteem and does desire to improve her communication skills for future interactions.    P:  Continue participation in skilled occupational therapy groups  1-2 times per week for 2 weeks in order to gain the necessary skills needed to return to full time community living and learn effective coping strategies to be a productive community resident. Follow up on HEP social and communication skills.                  OT Short Term Goals - 06/22/16 1242      OT SHORT TERM GOAL #1   Title Patient will be educated on strategies to improve psychosocial skills needed to participate fully in all daily, work, and leisure activities.   Time 3   Period Weeks   Status On-going     OT SHORT TERM GOAL #2   Title Patient will be educated on a HEP and independent with implementation of HEP.  Time 3   Period Weeks   Status On-going     OT SHORT TERM GOAL #3   Title Patient will independently apply psychosocial skills and coping mechanisms to her daily actiivties in order to function independently.   Time 3   Period Weeks   Status On-going                  Plan - 06/24/16 1243    Rehab Potential Good   OT Frequency --  1-2x/week   OT Duration --  3 weeks   OT Treatment/Interventions Self-care/ADL training;Patient/family education  coping skills development training, psychosocial skill  development, community reintegration   Consulted and Agree with Plan of Care Patient      Patient will benefit from skilled therapeutic intervention in order to improve the following deficits and impairments:   (decreased coping skills, decreased psychosocial skills)  Visit Diagnosis: Severe recurrent major depression without psychotic features (HCC)  Generalized anxiety disorder    Problem List Patient Active Problem List   Diagnosis Date Noted  . MDD (major depressive disorder), recurrent episode, severe (HCC) 06/01/2016  . GAD (generalized anxiety disorder) 04/10/2015  . Severe episode of recurrent major depressive disorder, without psychotic features (HCC) 04/10/2015  . PTSD (post-traumatic stress disorder) 04/10/2015  . Social anxiety disorder 04/10/2015  . OCD (obsessive compulsive disorder) 04/10/2015  . Routine general medical examination at a health care facility 04/10/2015  . Generalized anxiety disorder 02/08/2014  . Chronic cholecystitis with calculus 01/23/2014  . Major depressive disorder, recurrent episode (HCC) 10/27/2011   Ezra SitesLeslie Troxler, OTR/L  [phone] 06/24/2016, 12:44 PM  [facility] [address] [place], [place], [address] Phone: [phone]   Fax:  [phone]  Name: Jo Haas MRN: [id number] Date of Birth: Apr 13, 1996

## 2016-06-25 ENCOUNTER — Other Ambulatory Visit (HOSPITAL_COMMUNITY): Payer: PRIVATE HEALTH INSURANCE | Attending: Psychiatry | Admitting: Licensed Clinical Social Worker

## 2016-06-25 DIAGNOSIS — F338 Other recurrent depressive disorders: Secondary | ICD-10-CM | POA: Diagnosis not present

## 2016-06-25 DIAGNOSIS — X58XXXA Exposure to other specified factors, initial encounter: Secondary | ICD-10-CM | POA: Diagnosis not present

## 2016-06-25 DIAGNOSIS — F332 Major depressive disorder, recurrent severe without psychotic features: Secondary | ICD-10-CM

## 2016-06-25 DIAGNOSIS — F4310 Post-traumatic stress disorder, unspecified: Secondary | ICD-10-CM | POA: Insufficient documentation

## 2016-06-25 DIAGNOSIS — R45851 Suicidal ideations: Secondary | ICD-10-CM | POA: Diagnosis not present

## 2016-06-25 DIAGNOSIS — F411 Generalized anxiety disorder: Secondary | ICD-10-CM | POA: Insufficient documentation

## 2016-06-25 DIAGNOSIS — F428 Other obsessive-compulsive disorder: Secondary | ICD-10-CM | POA: Insufficient documentation

## 2016-06-25 NOTE — Psych (Signed)
   [facility]  Jo MinusMadison Haas [id number]  Session Time: 9 AM - 2 PM  Participation Level: Active  Behavioral Response: CasualAlertDepressed  Type of Therapy: Group Therapy  Treatment Goals addressed: Coping; Depression   Interventions: CBT, DBT, Supportive and Reframing  Summary: Clinician facilitated check-in regarding current stressors and situation, and review of patient completed diary card. Clinician utilized active listening and empathetic response and validated patient emotions. Clinician facilitated discussion on self care, purpose, and creativity as an outlet. Clinician continued topic of needs and group completed second half of needs assessment and discussed ways to meet needs which could be improved upon. Clinician led a communication role play regarding starting conversations.  Clinician assessed for immediate needs, and SI/HI. Cln asked pt to call if she was not feeling better by tomorrow.   Suicidal/Homicidal: Nowithout intent/plan  Therapist Response: Jo Haas is a 20 y.o. female who presents with depression, anxiety, and trauma symptoms. Patient presented within time allowed and in a "okay" mood. Patient rated herself at a "6" out of 10 with 1 being low and 10 being great. Patient reports she continues to be very tired and low energy. Patient engaged in discussion. Patient participated in needs assessment and communication role play. Patient stated practicing communication was helpful. Patient demonstrates some progress as evidenced by reporting improved mood and benefit from the role play. Patient denies SI/HI/self-harm thoughts   Plan: Patient will continue in PHP and medication management. Work towards decreasing depression symptoms and increase emotional regulation and positive coping skills.    Diagnosis: Severe recurrent major depression without psychotic features (HCC) [F33.2]    1. Severe recurrent major depression without  psychotic features (HCC)   2. Obsessive-compulsive disorder, unspecified type   3. PTSD (post-traumatic stress disorder)       Donia GuilesJenny Dariela Stoker, LCSW 06/25/2016

## 2016-06-28 ENCOUNTER — Other Ambulatory Visit (HOSPITAL_COMMUNITY): Payer: PRIVATE HEALTH INSURANCE

## 2016-06-28 NOTE — Psych (Signed)
   [facility]  Jo Haas [id number]  Session Time: 9 AM - 1 PM  Participation Level: Active  Behavioral Response: CasualAlertDepressed  Type of Therapy: Group Therapy  Treatment Goals addressed: Coping; Depression   Interventions: CBT, DBT, Supportive and Reframing  Summary: Clinician facilitated check-in regarding current stressors and situation, and review of patient completed diary card. Clinician utilized active listening and empathetic response and validated patient emotions. Clinician facilitated discussion on mindfulness, joy, and emotional reactions. Clinician reviewed topic of self soothe. Clinician led art therapy activity on visual self soothing. Group viewed TED talk regarding multipotentialites and discussed how perspective can shape our reality. Clinician assessed for immediate needs, and SI/HI.   Suicidal/Homicidal: Nowithout intent/plan  Therapist Response: Jo Haas is a 20 y.o. female who presents with depression, anxiety, and trauma symptoms. Patient presented within time allowed and in a "good" mood. Patient rated herself at a "7" out of 10 with 1 being low and 10 being great. Patient engaged in discussion. Patient participated in art therapy activity. Patient was able to identify skills that can be transferred from one area of life to another. Patient demonstrates some progress as evidenced by stating she is adding more water and exercise to her day and got some school work done. Patient denies SI/HI/self-harm thoughts   Plan: Patient will continue in PHP and medication management. Work towards decreasing depression symptoms and increase emotional regulation and positive coping skills.    Diagnosis: Severe recurrent major depression without psychotic features (HCC) [F33.2]    1. Severe recurrent major depression without psychotic features (HCC)   2. Generalized anxiety disorder       Donia GuilesJenny Lavell Ridings, LCSW 06/28/2016

## 2016-06-29 ENCOUNTER — Encounter (HOSPITAL_COMMUNITY): Payer: Self-pay | Admitting: Occupational Therapy

## 2016-06-29 ENCOUNTER — Other Ambulatory Visit (HOSPITAL_COMMUNITY): Payer: PRIVATE HEALTH INSURANCE | Admitting: Occupational Therapy

## 2016-06-29 ENCOUNTER — Other Ambulatory Visit (HOSPITAL_COMMUNITY): Payer: PRIVATE HEALTH INSURANCE | Admitting: Licensed Clinical Social Worker

## 2016-06-29 DIAGNOSIS — F411 Generalized anxiety disorder: Secondary | ICD-10-CM

## 2016-06-29 DIAGNOSIS — F332 Major depressive disorder, recurrent severe without psychotic features: Secondary | ICD-10-CM

## 2016-06-29 DIAGNOSIS — R45851 Suicidal ideations: Secondary | ICD-10-CM | POA: Diagnosis not present

## 2016-06-29 NOTE — Therapy (Addendum)
[place], [place], [address] Phone: [phone]   Fax:  [phone]  Occupational Therapy Treatment  Patient Details  Name: Jo Haas MRN: [id number] Date of Birth: July 04, 1996 No Data Recorded  Encounter Date: 06/29/2016      OT End of Session - 06/29/16 1621    Visit Number 4   Number of Visits 6   Date for OT Re-Evaluation 07/10/16   Authorization Type [employer]   Authorization Time Period no auth required   OT Start Time 1030   OT Stop Time 1130   OT Time Calculation (min) 60 min   Activity Tolerance Patient tolerated treatment well   Behavior During Therapy [facility] for tasks assessed/performed      Past Medical History:  Diagnosis Date  . Anxiety   . Depression   . Multiple body piercings    nose, ears  . PTSD (post-traumatic stress disorder)   . Vision abnormalities    corrective lenses    Past Surgical History:  Procedure Laterality Date  . LAPAROSCOPIC CHOLECYSTECTOMY SINGLE PORT N/A 03/04/2014   Procedure: LAPAROSCOPIC CHOLECYSTECTOMY SINGLE PORT;  Surgeon: Adin Hector, MD;  Location: [facility];  Service: General;  Laterality: N/A;    There were no vitals filed for this visit.      Subjective Assessment - 06/29/16 1621    Currently in Pain? 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 Discussed the difference in each skill set, Jo Haas participated in group discussion of what constitutes each skill and why they are important. She completed the My Ideal Job worksheet and shared findings with the group. She also completed the Draw My Wall worksheet identifying various elements as preventing her from achieving a job or  dream. OT, Jo Haas, and group discussed obstacles to goal achievement including health, schooling, and financial barriers.  A:  Patient participated in skilled occupational therapy group for job readiness this date.  Patient was engaged and open to discussion and strategies introduced. Pt provided with The Do's and Don'ts of Keeping a Job handout and the [employer] Job Readiness Skills Outline. Jo Haas completed homework contract sheet identifying her current strength for job readiness, what she wants to improve, and a goal for achieving her future career.   P:  Continue participation in skilled occupational therapy groups  1-2 times per week for 2 weeks in order to gain the necessary skills needed to return to full time community living and learn effective coping strategies to be a productive community resident. Follow up on HEP for job readiness.             OT Short Term Goals - 06/22/16 1242      OT SHORT TERM GOAL #1   Title Patient will be educated on strategies to improve psychosocial skills needed to participate fully in all daily, work, and leisure activities.   Time 3   Period Weeks   Status Partially Met     OT SHORT TERM GOAL #2   Title Patient will be educated on a HEP and independent with implementation of HEP.   Time 3   Period Weeks   Status  Partially Met     OT SHORT TERM GOAL #3   Title Patient will independently apply psychosocial skills and coping mechanisms to her daily actiivties in order to function independently.   Time 3   Period Weeks   Status Partially Met                  Plan - 06/29/16 1621    Rehab Potential Good   OT Frequency --  1-2x/week   OT Duration --  3 weeks   OT Treatment/Interventions Self-care/ADL training;Patient/family education  coping skills development training, psychosocial skill development, community reintegration   Consulted and Agree with Plan of Care Patient      Patient will benefit from skilled therapeutic  intervention in order to improve the following deficits and impairments:   (decreased coping skills, decreased psychosocial skills)  Visit Diagnosis: Severe recurrent major depression without psychotic features ([place])  Generalized anxiety disorder    Problem List Patient Active Problem List   Diagnosis Date Noted  . MDD (major depressive disorder), recurrent episode, severe ([place]) 06/01/2016  . GAD (generalized anxiety disorder) 04/10/2015  . Severe episode of recurrent major depressive disorder, without psychotic features ([place]) 04/10/2015  . PTSD (post-traumatic stress disorder) 04/10/2015  . Social anxiety disorder 04/10/2015  . OCD (obsessive compulsive disorder) 04/10/2015  . Routine general medical examination at a health care facility 04/10/2015  . Generalized anxiety disorder 02/08/2014  . Chronic cholecystitis with calculus 01/23/2014  . Major depressive disorder, recurrent episode ([place]) 10/27/2011   Jo Haas, OTR/L  [phone] 06/29/2016, 4:22 PM  [facility] PARTIAL HOSPITALIZATION PROGRAM [place], [place], [address] Phone: [phone]   Fax:  [phone]  Name: Jo Haas MRN: [id number] Date of Birth: 04-11-1996   OCCUPATIONAL THERAPY DISCHARGE SUMMARY  Visits from Start of Care: 4  Current functional level related to goals / functional outcomes: Pt participated in 4/6 OT group sessions, actively engaging in group discussions and activities, as well as individual assignments. Pt demonstrates knowledge of coping skills in all topic areas including time management, job readiness, social and communication skills, and sleep hygiene.   Remaining deficits: Pt continues to struggle with anxiety and stress management.    Education / Equipment: Pt provided with strategies and information on the above mentioned areas.   Plan: Patient agrees to discharge.  Patient goals were partially met. Patient is being discharged  due to meeting the stated rehab goals.  ?????

## 2016-06-30 ENCOUNTER — Other Ambulatory Visit (HOSPITAL_COMMUNITY): Payer: PRIVATE HEALTH INSURANCE | Admitting: Licensed Clinical Social Worker

## 2016-06-30 DIAGNOSIS — F332 Major depressive disorder, recurrent severe without psychotic features: Secondary | ICD-10-CM

## 2016-06-30 DIAGNOSIS — R45851 Suicidal ideations: Secondary | ICD-10-CM | POA: Diagnosis not present

## 2016-06-30 DIAGNOSIS — F411 Generalized anxiety disorder: Secondary | ICD-10-CM

## 2016-06-30 NOTE — Psych (Signed)
   [facility]  Jo MinusMadison Haas [id number]  Session Time: 9 AM - 2 PM  Participation Level: Active  Behavioral Response: CasualAlertDepressed  Type of Therapy: Group Therapy  Treatment Goals addressed: Coping; Depression   Interventions: CBT, DBT, Supportive and Reframing  Summary: Clinician facilitated check-in regarding current stressors and situation, and review of patient completed diary card. Clinician utilized active listening and empathetic response and validated patient emotions. Clinician facilitated discussion on support systems, motivation, and focus. Clinician introduced topic of assertiveness and utilized handout "An Assertive Bill of Rights."  Clinician assessed for immediate needs, and SI/HI.   Suicidal/Homicidal: Nowithout intent/plan  Therapist Response: Jo Haas is a 20 y.o. female who presents with depression, anxiety, and trauma symptoms. Patient presented within time allowed and in a "okay" mood. Patient rated herself at a "5" out of 10 with 1 being low and 10 being great. Patient engaged in discussion. Patient identified areas in which it is difficult to be assertive and why. Patient problem solved ways to increase assertiveness. Patient demonstrates limited progress as evidenced by reporting she was out yesterday due to not wanting to get out of bed and reverting into black and white thinking. Patient denies SI/HI/self-harm thoughts   Plan: Patient will continue in PHP and medication management. Work towards decreasing depression symptoms and increase emotional regulation and positive coping skills.    Diagnosis: Severe recurrent major depression without psychotic features (HCC) [F33.2]    1. Severe recurrent major depression without psychotic features (HCC)   2. Generalized anxiety disorder       Donia GuilesJenny Shineka Auble, LCSW 06/30/2016

## 2016-07-01 ENCOUNTER — Other Ambulatory Visit (HOSPITAL_COMMUNITY): Payer: PRIVATE HEALTH INSURANCE

## 2016-07-01 ENCOUNTER — Ambulatory Visit (HOSPITAL_COMMUNITY): Payer: Self-pay

## 2016-07-01 NOTE — Psych (Signed)
   [facility]  Jo Haas [id number]  Session Time: 9 AM - 2 PM  Participation Level: Active  Behavioral Response: CasualAlertDepressed  Type of Therapy: Group Therapy  Treatment Goals addressed: Coping; Depression   Interventions: CBT, DBT, Supportive and Reframing  Summary: Clinician facilitated check-in regarding current stressors and situation, and review of patient completed diary card. Clinician utilized active listening and empathetic response and validated patient emotions. Clinician facilitated discussion on feelings of failure, trying new things, and communication. Group brainstormed ways to add positive things into life and ways to reframe disappointments to achieve positive outcomes.   Clinician assessed for immediate needs, and SI/HI.   Suicidal/Homicidal: Nowithout intent/plan  Therapist Response: Jo Haas is a 20 y.o. female who presents with depression, anxiety, and trauma symptoms. Patient presented within time allowed and in a "pretty good" mood. Patient rated herself at a "7" out of 10 with 1 being low and 10 being great. Patient engaged in discussion. Patient defined concept of success for her and identified ways in which the perception of success effected her self esteem and willingness. Patient shared ways she can reframe thought patterns to alter these perceptions. Patient demonstrates progress as evidenced by brightened mood while interacting in group and reframing negative thoughts as they arose. Patient denies SI/HI/self-harm thoughts   Plan: Patient will continue in PHP and medication management. Work towards decreasing depression symptoms and increase emotional regulation and positive coping skills.    Diagnosis: Severe recurrent major depression without psychotic features (HCC) [F33.2]    1. Severe recurrent major depression without psychotic features (HCC)   2. Generalized anxiety disorder       Donia GuilesJenny Chamar Broughton,  LCSW 07/01/2016

## 2016-07-02 ENCOUNTER — Other Ambulatory Visit (HOSPITAL_COMMUNITY): Payer: PRIVATE HEALTH INSURANCE | Admitting: Licensed Clinical Social Worker

## 2016-07-02 DIAGNOSIS — R45851 Suicidal ideations: Secondary | ICD-10-CM | POA: Diagnosis not present

## 2016-07-02 DIAGNOSIS — F332 Major depressive disorder, recurrent severe without psychotic features: Secondary | ICD-10-CM

## 2016-07-02 DIAGNOSIS — F411 Generalized anxiety disorder: Secondary | ICD-10-CM

## 2016-07-05 ENCOUNTER — Encounter (HOSPITAL_COMMUNITY): Payer: Self-pay | Admitting: Psychiatry

## 2016-07-05 ENCOUNTER — Other Ambulatory Visit (HOSPITAL_COMMUNITY): Payer: PRIVATE HEALTH INSURANCE

## 2016-07-05 ENCOUNTER — Other Ambulatory Visit (HOSPITAL_COMMUNITY): Payer: PRIVATE HEALTH INSURANCE | Admitting: Psychiatry

## 2016-07-05 NOTE — Psych (Signed)
   [facility]  Jo MinusMadison Lashley [id number]  Session Time: 9 AM - 2 PM  Participation Level: Active  Behavioral Response: CasualAlertDepressed  Type of Therapy: Group Therapy  Treatment Goals addressed: Coping; Depression   Interventions: CBT, DBT, Supportive and Reframing  Summary: Clinician facilitated check-in regarding current stressors and situation, and review of patient completed diary card. Clinician utilized active listening and empathetic response and validated patient emotions. Clinician facilitated discussion on feelings of failure, trying new things, and communication. Group brainstormed ways to add positive things into life and ways to reframe disappointments to achieve positive outcomes.   Clinician assessed for immediate needs, and SI/HI.   Suicidal/Homicidal: Nowithout intent/plan  Therapist Response: Jo Haas is a 20 y.o. female who presents with depression, anxiety, and trauma symptoms. Patient presented within time allowed and in a  "good" mood. Patient rated herself at a 8 out of 10 with 1 being low and 10 being great. Patient engaged in discussion. Patient problem solved recent issues with communication and participated in art therapy activity.  Patient demonstrates progress as evidenced by continued brightened affect and stating she feels she has made improvement. Patient denies SI/HI/self-harm thoughts   Plan: Patient will discharge from PHP due to meeting treatment goals of decreased depression and anxiety symptoms, increased ability to manage emotions. Improvement is marked by self-report, observation, and improved screening scores. Patient's discharge was approved by program psychiatrist. Patient will step down to IOP within this agency and reports alignment with this plan. Patient is scheduled to begin IOP on Monday 07/05/16 at 9 am.   Diagnosis: Severe recurrent major depression without psychotic features (HCC) [F33.2]    1.  Severe recurrent major depression without psychotic features (HCC)   2. Generalized anxiety disorder       Donia GuilesJenny Damarious [place], LCSW 07/05/2016
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
## 2016-07-06 ENCOUNTER — Ambulatory Visit (HOSPITAL_COMMUNITY): Payer: Self-pay

## 2016-07-06 ENCOUNTER — Other Ambulatory Visit (HOSPITAL_COMMUNITY): Payer: PRIVATE HEALTH INSURANCE

## 2016-07-06 ENCOUNTER — Other Ambulatory Visit (HOSPITAL_COMMUNITY): Payer: PRIVATE HEALTH INSURANCE | Admitting: Psychiatry

## 2016-07-07 ENCOUNTER — Other Ambulatory Visit (HOSPITAL_COMMUNITY): Payer: PRIVATE HEALTH INSURANCE

## 2016-07-07 ENCOUNTER — Other Ambulatory Visit (HOSPITAL_COMMUNITY): Payer: PRIVATE HEALTH INSURANCE | Admitting: Psychiatry

## 2016-07-07 DIAGNOSIS — F332 Major depressive disorder, recurrent severe without psychotic features: Secondary | ICD-10-CM

## 2016-07-07 DIAGNOSIS — F331 Major depressive disorder, recurrent, moderate: Secondary | ICD-10-CM

## 2016-07-07 DIAGNOSIS — R45851 Suicidal ideations: Secondary | ICD-10-CM | POA: Diagnosis not present

## 2016-07-07 NOTE — Progress Notes (Signed)
    Daily Group Progress Note  Program: IOP  Group Time: 9:00-12:00  Participation Level: Active  Behavioral Response: Appropriate  Type of Therapy:  Group Therapy  Summary of Progress: Pt. Presents with primarily flat affect,depressed, but responsive to discussion prompts from counselor and other group members. Pt. Participated in discussion about having appropriate relationship boundaries and connected with other group members around themes of being hurt by family members and friends and developing awareness of the necessity of emotional boundaries. Pt. Discussed that group had been helpful for her and was interested in services provided by the mental health association. Pt. Agreed to call [facility] with the assistance of the counselor and [facility] and [facility] for call back and scheduling of orientation session. Pt. Indicated a desire to continue with [facility].     Shaune PollackBrown, Jennifer B, LPC

## 2016-07-07 NOTE — Progress Notes (Addendum)
Transfer note from partial hospital to intensive outpatient  Jo Haas was admitted to inpatient and then transferred to partial hospital after having suicidal thoughts and being afraid she would act on the thoughts.  After PHP she is feeling much better but is still uneasy about not getting daily support.  Consequently transferred to IOP.   So far she is not finding the IOP helpful.  Does not identify with the older patients and some of the things they talk about are depressing her more than helping her as it makes her feel that even if as she gets older she might not get better.  She does not want to give up without giving it a fair shot she says.    Plan:  She will decide whether she wants to continue or not.  Her alternative are the groups at [facility] along with outpatient therapy  Retain diagnoses of major depression, moderate recurrent and PTSD

## 2016-07-08 ENCOUNTER — Ambulatory Visit (HOSPITAL_COMMUNITY): Payer: Self-pay

## 2016-07-08 ENCOUNTER — Other Ambulatory Visit (HOSPITAL_COMMUNITY): Payer: PRIVATE HEALTH INSURANCE | Admitting: Psychiatry

## 2016-07-08 DIAGNOSIS — F331 Major depressive disorder, recurrent, moderate: Secondary | ICD-10-CM

## 2016-07-08 DIAGNOSIS — R45851 Suicidal ideations: Secondary | ICD-10-CM | POA: Diagnosis not present

## 2016-07-08 NOTE — Progress Notes (Signed)
    Daily Group Progress Note  Program: IOP  Group Time: 9:00-12:00  Participation Level: Active  Behavioral Response: Appropriate  Type of Therapy:  Group Therapy  Summary of Progress: Pt. Presented with significantly improved affect [facility]ative, smiled and laughed appropriately, engaged in the group process. Pt. Shared her history with the group of being challenged by school and feeling pressure from her parents which made her feel that her self-worth was connected to her academic performance. Pt. Shared that she had learned to manage her day by approaching the day incrementally instead of all at once. Pt. Also shared feedback with the group about resisting guilt that is imposed by others in relationships and asking for what we need in relationships. Pt. Shared that the holidays are hard for her because she lost her grandmother during Christmas. Pt. Was attentive during reflective reading.       Shaune PollackBrown, Amamda Curbow B, LPC

## 2016-07-09 ENCOUNTER — Other Ambulatory Visit (HOSPITAL_COMMUNITY): Payer: PRIVATE HEALTH INSURANCE | Admitting: Psychiatry

## 2016-07-09 DIAGNOSIS — R45851 Suicidal ideations: Secondary | ICD-10-CM | POA: Diagnosis not present

## 2016-07-09 DIAGNOSIS — F331 Major depressive disorder, recurrent, moderate: Secondary | ICD-10-CM

## 2016-07-12 ENCOUNTER — Other Ambulatory Visit (HOSPITAL_COMMUNITY): Payer: PRIVATE HEALTH INSURANCE
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
## 2016-07-13 ENCOUNTER — Other Ambulatory Visit (HOSPITAL_COMMUNITY): Payer: PRIVATE HEALTH INSURANCE

## 2016-07-13 ENCOUNTER — Ambulatory Visit (HOSPITAL_COMMUNITY): Payer: Self-pay

## 2016-07-13 ENCOUNTER — Telehealth (HOSPITAL_COMMUNITY): Payer: Self-pay | Admitting: Psychiatry

## 2016-07-14 ENCOUNTER — Other Ambulatory Visit (HOSPITAL_COMMUNITY): Payer: PRIVATE HEALTH INSURANCE | Admitting: Psychiatry

## 2016-07-14 DIAGNOSIS — F331 Major depressive disorder, recurrent, moderate: Secondary | ICD-10-CM

## 2016-07-14 DIAGNOSIS — R45851 Suicidal ideations: Secondary | ICD-10-CM | POA: Diagnosis not present

## 2016-07-14 NOTE — Progress Notes (Signed)
    Daily Group Progress Note  Program: IOP  Group Time: 9:00-12:00  Participation Level: Active  Behavioral Response: Appropriate  Type of Therapy:  Group Therapy  Summary of Progress: Pt. Continues to present with brightened affect, engaged in group process, alert and attentive. Pt. Draws in group which helps her to ground and manage her anxiety. Pt. Gives thoughtful feedback to group and is receptive to feedback from other members of the group. Pt. Shared her experience of in the past not feeling that she could not please her father.       Shaune PollackBrown, Jahleah Mariscal B, LPC

## 2016-07-15 ENCOUNTER — Ambulatory Visit (HOSPITAL_COMMUNITY): Payer: Self-pay

## 2016-07-15 ENCOUNTER — Other Ambulatory Visit (HOSPITAL_COMMUNITY): Payer: PRIVATE HEALTH INSURANCE | Admitting: Psychiatry

## 2016-07-15 DIAGNOSIS — F331 Major depressive disorder, recurrent, moderate: Secondary | ICD-10-CM

## 2016-07-15 DIAGNOSIS — R45851 Suicidal ideations: Secondary | ICD-10-CM | POA: Diagnosis not present

## 2016-07-15 NOTE — Progress Notes (Signed)
    Daily Group Progress Note  Program: IOP  Group Time: 9:00-12:00  Participation Level: Active  Behavioral Response: Appropriate  Type of Therapy:  Group Therapy  Summary of Progress: Pt. Presents with brightened affect, attentive, engaged in the group process. Pt. Sketches in book to help to calm and and ground herself in group. Pt. Discussed that she applied makeup today not to cover her flaws as she has done in the past but because it makes her feel good. Pt. Participated in discussion about how childhood neglect and trauma affects adult relationships.       Shaune PollackBrown, Jennifer B, LPC

## 2016-07-16 ENCOUNTER — Other Ambulatory Visit (HOSPITAL_COMMUNITY): Payer: PRIVATE HEALTH INSURANCE | Admitting: Psychiatry

## 2016-07-16 DIAGNOSIS — R45851 Suicidal ideations: Secondary | ICD-10-CM | POA: Diagnosis not present

## 2016-07-16 DIAGNOSIS — F331 Major depressive disorder, recurrent, moderate: Secondary | ICD-10-CM

## 2016-07-16 NOTE — Progress Notes (Signed)
    Daily Group Progress Note  Program: IOP  Group Time: 0900-1200  Participation Level: Minimal  Behavioral Response: Appropriate  Type of Therapy:  Process Group and Psycho-Ed  Summary of Progress: Pt arrived this morning wearing make-up and a dress.  Pt smiled when Clinical research associatewriter complimented how pretty she looked.  Dr. Huel Coteichard Thomas led process group with emphasis on Personality and Addictions.  He explained the process of any addiction and the development of an addictive personality. Pt expressed that the group was very helpful.  Pt even gave appropriate feedback to a certain peer. During the last hour Bob Hamiliton, LPC discussed grief/loss issues in Psycho-Ed group.         Chestine SporeLARK, RITA, M.Ed, CNA

## 2016-07-20 ENCOUNTER — Ambulatory Visit (HOSPITAL_COMMUNITY): Payer: Self-pay

## 2016-07-20 ENCOUNTER — Other Ambulatory Visit (HOSPITAL_COMMUNITY): Payer: PRIVATE HEALTH INSURANCE

## 2016-07-21 ENCOUNTER — Other Ambulatory Visit (HOSPITAL_COMMUNITY): Payer: PRIVATE HEALTH INSURANCE

## 2016-07-21 ENCOUNTER — Other Ambulatory Visit (HOSPITAL_COMMUNITY): Payer: Self-pay

## 2016-07-21 MED ORDER — VENLAFAXINE HCL ER 75 MG PO CP24: 75.0000 mg | ORAL_CAPSULE | Freq: Every day | ORAL | 0 refills | Status: DC

## 2016-07-21 MED ORDER — ARIPIPRAZOLE 2 MG PO TABS: 2.0000 mg | ORAL_TABLET | Freq: Every day | ORAL | 0 refills | Status: DC

## 2016-07-21 NOTE — Telephone Encounter (Signed)
Patient called for a refill on Effexor and Abilify that were prescribed while she was inpatient - she had already been out for a couple of days. Because you are not the prescriber on these I sent in enough to get her to her appointment on 1/4 ( 8 tabs of each medication) and let her know, we also discussed the importance of calling me before she is running out so that this does not happen again.

## 2016-07-21 NOTE — Progress Notes (Deleted)
Returned voicemail from [place]  [phone]) who requested to return call regarding extension. Informed Traci that patient did not attend group today 07/21/2016. Traci requests that someone call her on 07/22/2016 and if patient attends she will consider an extension, however, if patient does not attend, patient will likely not be granted the extension. Informed Traci that  SinkRita will return on Friday 07/23/2016.  Davina PokeJoVea Shanon Seawright, LCSW Therapeutic Triage Specialist [facility] 07/21/2016 12:04 PM

## 2016-07-22 ENCOUNTER — Ambulatory Visit (HOSPITAL_COMMUNITY): Payer: Self-pay

## 2016-07-22 ENCOUNTER — Other Ambulatory Visit (HOSPITAL_COMMUNITY): Payer: PRIVATE HEALTH INSURANCE | Admitting: Psychiatry

## 2016-07-22 DIAGNOSIS — F331 Major depressive disorder, recurrent, moderate: Secondary | ICD-10-CM

## 2016-07-22 DIAGNOSIS — R45851 Suicidal ideations: Secondary | ICD-10-CM | POA: Diagnosis not present

## 2016-07-22 MED ORDER — VENLAFAXINE HCL ER 75 MG PO CP24: 75.0000 mg | ORAL_CAPSULE | Freq: Every day | ORAL | 2 refills | Status: DC

## 2016-07-22 MED ORDER — ARIPIPRAZOLE 2 MG PO TABS: 2.0000 mg | ORAL_TABLET | Freq: Every day | ORAL | 2 refills | Status: DC

## 2016-07-22 NOTE — Progress Notes (Signed)
    Daily Group Progress Note  Program: IOP  Group Time: 9:00-12:00  Participation Level: Active  Behavioral Response: Appropriate  Type of Therapy:  Group Therapy  Summary of Progress: Pt. Presented as alert, engaged in the group process, brightened affect. Pt. Reported to the group about her self-care which she stated was her decision to apply makeup today, shower, get dressed, and prepare food for herself. Pt. Continues to provide meaningful feedback to others regarding relationship boundaries and making themselves a priority for personal self-care. Pt. Discussed readiness to discharge from group tomorrow.      Shaune PollackBrown, Jennifer B, LPC

## 2016-07-22 NOTE — Progress Notes (Signed)
[facility] IOP DISCHARGE NOTE  Patient:  Jo Haas DOB:  18-Sep-1995  Date of Admission: 07/05/2016  Date of Discharge: 07/22/2016  Reason for Admission:step down from partial hospital program being treated for depression  IOP Course:attended and participated.n  Maintained her progress from PHP.  Still depressed but managing her life better and no suicidal thoughts  Mental Status at Discharge:no suicidal thoughts  Diagnosis: major depression, recurrent, moderate.  PTSD  Level of Care:  IOP  Discharge destination:has appointment with psychiatrist and therapist after discharge      Comments:  Makes steady progress and warmed up to the group over time  The patient received suicide prevention pamphlet:  Yes   Carolanne GrumblingGerald Arno Cullers, MD

## 2016-07-23 ENCOUNTER — Other Ambulatory Visit (HOSPITAL_COMMUNITY): Payer: PRIVATE HEALTH INSURANCE | Admitting: Psychiatry

## 2016-07-23 DIAGNOSIS — R45851 Suicidal ideations: Secondary | ICD-10-CM | POA: Diagnosis not present

## 2016-07-23 DIAGNOSIS — F331 Major depressive disorder, recurrent, moderate: Secondary | ICD-10-CM
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
## 2016-07-23 NOTE — Patient Instructions (Signed)
Patient completed MH-IOP today.  Will follow up with Dr. Michae KavaAgarwal on 07-29-16 @ 10:30 a.m.  Provided pt with a new therapist business card (per patient's request).  Pt will contact M[facility]Beth Kincaid, [facility] for [facility].

## 2016-07-27 ENCOUNTER — Telehealth (HOSPITAL_COMMUNITY): Payer: Self-pay

## 2016-07-27 NOTE — Progress Notes (Signed)
    Daily Group Progress Note  Program: IOP  Group Time: 9:00-12:00 (11:00am-11:45am Therapy with the Chaplain-Bob Hamilton)    Participation Level:  active   Behavioral Response:  responsive  Type of Therapy:   group therapy   Summary of Progress:  Patient reports feeling anxious today. Shared that she has a diagnosis of anxiety, depression, and PTSD. She was reflecting today on past events that were not pleasant for her. She felt like she has often been on a verge of a panic attack. However, shared with the group that although she often feels anxious she has learned to use her coping skills. She shared with group that today she used "Self Care" measures. Patient shared with the group that she typically dresses in t-shirts and jeans. Today she decided to "dress up". Patient says that this makes her feel better about herself. Patient continues to struggle with finding motivation mainly with school and in friendships.  Patient was observed to be anxious during group. Patient mentioned that she wants to feel worthy and hopeful about life despite negative past events. She appeared anxious but motivated as she shared that she plans to attend school next semester with a positive attitude and new outlook on her future. Patient given a homework assignment yesterday to write a letter to members of her support system about the changes that they were making her life. She did not complete that homework assignment last night but plans to work on her letter this weekend. Today was also patient's last day of Psych IOP. The other patient's in group provided words of encouragement and advice. Patient was receptive and resonated on moving forward. Patient plans to continue with individual therapy upon discharge from Psych IOP.  Jo Haas, Jo Haas, LPC

## 2016-07-27 NOTE — Telephone Encounter (Signed)
Case manager with the patients insurance Jo Ana(Tracy) called, she states she spoke to patient today and patient is still having issues with getting out of the bed. Patient says she is not really depressed and she does feel better, however she has no energy. Jo Haas wanted you to be aware and perhaps order Vitamin D level and a CMP to check iron levels.

## 2016-07-29 ENCOUNTER — Ambulatory Visit (INDEPENDENT_AMBULATORY_CARE_PROVIDER_SITE_OTHER): Payer: PRIVATE HEALTH INSURANCE | Admitting: Psychiatry

## 2016-07-29 ENCOUNTER — Encounter (HOSPITAL_COMMUNITY): Payer: Self-pay | Admitting: Psychiatry

## 2016-07-29 VITALS — BP 110/68 | HR 90 | Ht 61.0 in | Wt 139.4 lb

## 2016-07-29 DIAGNOSIS — Z811 Family history of alcohol abuse and dependence: Secondary | ICD-10-CM

## 2016-07-29 DIAGNOSIS — F332 Major depressive disorder, recurrent severe without psychotic features: Secondary | ICD-10-CM | POA: Diagnosis not present

## 2016-07-29 DIAGNOSIS — F4010 Social phobia, unspecified: Secondary | ICD-10-CM

## 2016-07-29 DIAGNOSIS — Z79899 Other long term (current) drug therapy: Secondary | ICD-10-CM

## 2016-07-29 DIAGNOSIS — F4310 Post-traumatic stress disorder, unspecified: Secondary | ICD-10-CM | POA: Diagnosis not present

## 2016-07-29 DIAGNOSIS — Z825 Family history of asthma and other chronic lower respiratory diseases: Secondary | ICD-10-CM

## 2016-07-29 DIAGNOSIS — F411 Generalized anxiety disorder: Secondary | ICD-10-CM

## 2016-07-29 DIAGNOSIS — Z818 Family history of other mental and behavioral disorders: Secondary | ICD-10-CM

## 2016-07-29 MED ORDER — VENLAFAXINE HCL ER 150 MG PO CP24: 150.0000 mg | ORAL_CAPSULE | Freq: Every day | ORAL | 2 refills | Status: DC

## 2016-07-29 MED ORDER — ARIPIPRAZOLE 5 MG PO TABS: 5.0000 mg | ORAL_TABLET | Freq: Every day | ORAL | 2 refills | Status: DC
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
## 2016-08-01 ENCOUNTER — Emergency Department (HOSPITAL_COMMUNITY)
Admission: EM | Admit: 2016-08-01 | Discharge: 2016-08-02 | Disposition: A | Discharge status: Other Acute Inpatient Hospital | Payer: PRIVATE HEALTH INSURANCE | Attending: Emergency Medicine | Admitting: Emergency Medicine

## 2016-08-01 ENCOUNTER — Encounter (HOSPITAL_COMMUNITY): Payer: Self-pay | Admitting: *Deleted

## 2016-08-01 DIAGNOSIS — Z79899 Other long term (current) drug therapy: Secondary | ICD-10-CM | POA: Insufficient documentation

## 2016-08-01 DIAGNOSIS — T5492XA Toxic effect of unspecified corrosive substance, intentional self-harm, initial encounter: Secondary | ICD-10-CM | POA: Insufficient documentation

## 2016-08-01 DIAGNOSIS — F332 Major depressive disorder, recurrent severe without psychotic features: Secondary | ICD-10-CM | POA: Diagnosis not present

## 2016-08-01 LAB — I-STAT BETA HCG BLOOD, ED (MC, WL, AP ONLY)

## 2016-08-01 LAB — COMPREHENSIVE METABOLIC PANEL
ALBUMIN: 3.6 g/dL (ref 3.5–5.0)
ALT: 11 U/L — ABNORMAL LOW (ref 14–54)
AST: 18 U/L (ref 15–41)
Alkaline Phosphatase: 71 U/L (ref 38–126)
Anion gap: 6 (ref 5–15)
BUN: 10 mg/dL (ref 6–20)
CHLORIDE: 107 mmol/L (ref 101–111)
CO2: 26 mmol/L (ref 22–32)
CREATININE: 0.46 mg/dL (ref 0.44–1.00)
Calcium: 8.7 mg/dL — ABNORMAL LOW (ref 8.9–10.3)
GFR calc Af Amer: >60 mL/min (ref >60)
GLUCOSE: 116 mg/dL — AB (ref 65–99)
POTASSIUM: 3.9 mmol/L (ref 3.5–5.1)
SODIUM: 139 mmol/L (ref 135–145)
Total Bilirubin: 0.8 mg/dL (ref 0.3–1.2)
Total Protein: 7.2 g/dL (ref 6.5–8.1)

## 2016-08-01 LAB — CBC WITH DIFFERENTIAL/PLATELET
Basophils Absolute: 0 10*3/uL (ref 0.0–0.1)
Basophils Relative: 0 %
EOS ABS: 0.1 10*3/uL (ref 0.0–0.7)
EOS PCT: 1 %
HCT: 33.2 % — ABNORMAL LOW (ref 36.0–46.0)
Hemoglobin: 11.5 g/dL — ABNORMAL LOW (ref 12.0–15.0)
LYMPHS ABS: 2 10*3/uL (ref 0.7–4.0)
LYMPHS PCT: 19 %
MCH: 29.2 pg (ref 26.0–34.0)
MCHC: 34.6 g/dL (ref 30.0–36.0)
MCV: 84.3 fL (ref 78.0–100.0)
MONO ABS: 0.6 10*3/uL (ref 0.1–1.0)
MONOS PCT: 5 %
Neutro Abs: 8.1 10*3/uL — ABNORMAL HIGH (ref 1.7–7.7)
Neutrophils Relative %: 75 %
PLATELETS: 337 10*3/uL (ref 150–400)
RBC: 3.94 MIL/uL (ref 3.87–5.11)
RDW: 12.5 % (ref 11.5–15.5)
WBC: 10.7 10*3/uL — AB (ref 4.0–10.5)

## 2016-08-01 LAB — RAPID URINE DRUG SCREEN, HOSP PERFORMED
AMPHETAMINES: NOT DETECTED
BARBITURATES: NOT DETECTED
BENZODIAZEPINES: NOT DETECTED
COCAINE: NOT DETECTED
Opiates: NOT DETECTED
Tetrahydrocannabinol: NOT DETECTED

## 2016-08-01 LAB — URINALYSIS, ROUTINE W REFLEX MICROSCOPIC
BILIRUBIN URINE: NEGATIVE
GLUCOSE, UA: NEGATIVE mg/dL
Ketones, ur: NEGATIVE mg/dL
Nitrite: NEGATIVE
Protein, ur: NEGATIVE mg/dL
SPECIFIC GRAVITY, URINE: 1.015 (ref 1.005–1.030)
pH: 5.5 (ref 5.0–8.0)

## 2016-08-01 LAB — SALICYLATE LEVEL: Salicylate Lvl: <7 mg/dL (ref 2.8–30.0)

## 2016-08-01 LAB — URINALYSIS, MICROSCOPIC (REFLEX)

## 2016-08-01 LAB — ACETAMINOPHEN LEVEL

## 2016-08-01 LAB — ETHANOL: Alcohol, Ethyl (B): <5 mg/dL (ref <5)

## 2016-08-01 LAB — PREGNANCY, URINE: Preg Test, Ur: NEGATIVE

## 2016-08-01 MED ORDER — VENLAFAXINE HCL ER 75 MG PO CP24
150.0000 mg | ORAL_CAPSULE | Freq: Every day | ORAL | Status: DC
  Administered 2016-08-02: 150 mg via ORAL
  Filled 2016-08-01: qty 2

## 2016-08-01 MED ORDER — ARIPIPRAZOLE 5 MG PO TABS
5.0000 mg | ORAL_TABLET | Freq: Every day | ORAL | Status: DC
  Administered 2016-08-02: 5 mg via ORAL
  Filled 2016-08-01: qty 1

## 2016-08-01 MED ORDER — TRAZODONE HCL 50 MG PO TABS: 50.0000 mg | ORAL_TABLET | Freq: Every evening | ORAL | Status: DC | PRN

## 2016-08-01 MED ORDER — HYDROXYZINE HCL 10 MG PO TABS
10.0000 mg | ORAL_TABLET | Freq: Three times a day (TID) | ORAL | Status: DC | PRN
  Filled 2016-08-01: qty 1

## 2016-08-01 MED ORDER — ACETAMINOPHEN 325 MG PO TABS: 650.0000 mg | ORAL_TABLET | ORAL | Status: DC | PRN

## 2016-08-01 NOTE — ED Provider Notes (Signed)
[facility]-EMERGENCY DEPT Provider Note   CSN: [id number] Arrival date & time: 08/01/16  1728     History   Chief Complaint Chief Complaint  Patient presents with  . Poisoning    HPI Jo Haas is a 21 y.o. female.  Patient is a 21 year old female with a history of anxiety, depression and PTSD. She states that she got into an argument with her mother. They were talking about her worsening depression. And she got angry and port a little bit of the works toilet bowl cleaner into her hand and drink it. She states it was only a small amount. She denies any other ingestions. She denies any alcohol use. She denies any symptoms. There is no burning pains in her mouth. No difficulty swallowing. No soreness to her throat. She states she has been having thoughts of wanting to kill herself today.      Past Medical History:  Diagnosis Date  . Anxiety   . Depression   . Multiple body piercings    nose, ears  . PTSD (post-traumatic stress disorder)   . Vision abnormalities    corrective lenses    Patient Active Problem List   Diagnosis Date Noted  . MDD (major depressive disorder), recurrent episode, severe (HCC) 06/01/2016  . GAD (generalized anxiety disorder) 04/10/2015  . Severe episode of recurrent major depressive disorder, without psychotic features (HCC) 04/10/2015  . PTSD (post-traumatic stress disorder) 04/10/2015  . Social anxiety disorder 04/10/2015  . OCD (obsessive compulsive disorder) 04/10/2015  . Routine general medical examination at a health care facility 04/10/2015  . Generalized anxiety disorder 02/08/2014  . Chronic cholecystitis with calculus 01/23/2014  . Major depressive disorder, recurrent episode (HCC) 10/27/2011    Past Surgical History:  Procedure Laterality Date  . LAPAROSCOPIC CHOLECYSTECTOMY SINGLE PORT N/A 03/04/2014   Procedure: LAPAROSCOPIC CHOLECYSTECTOMY SINGLE PORT;  Surgeon: Ardeth Sportsman, MD;  Location: [facility] ORS;  Service: General;   Laterality: N/A;    OB History    Gravida Para Term Preterm AB Living   0 0 0 0 0 0   SAB TAB Ectopic Multiple Live Births   0 0 0 0         Home Medications    Prior to Admission medications   Medication Sig Start Date End Date Taking? Authorizing Provider  ARIPiprazole (ABILIFY) 5 MG tablet Take 1 tablet (5 mg total) by mouth at bedtime. 07/29/16  Yes Oletta Darter, MD  hydrOXYzine (ATARAX/VISTARIL) 10 MG tablet TAKE 1 TABLET (10 MG TOTAL) BY MOUTH 3 (THREE) TIMES DAILY AS NEEDED FOR ANXIETY (ANXIETY). 04/26/16  Yes Historical Provider, MD  traZODone (DESYREL) 50 MG tablet TAKE 1 TABLET BY MOUTH EVERY DAY AT BEDTIME AS NEEDED FOR SLEEP 06/13/16  Yes Historical Provider, MD  venlafaxine XR (EFFEXOR-XR) 150 MG 24 hr capsule Take 1 capsule (150 mg total) by mouth daily with breakfast. 07/29/16  Yes Oletta Darter, MD    Family History Family History  Problem Relation Age of Onset  . Depression Mother   . Varicose Veins Mother   . Asthma Brother   . Alcohol abuse Father   . Anxiety disorder Father   . COPD Paternal Grandmother     Social History Social History  Substance Use Topics  . Smoking status: Never Smoker  . Smokeless tobacco: Never Used  . Alcohol use No     Allergies   Amoxicillin and Pollen extract   Review of Systems Review of Systems  Constitutional: Negative for chills, diaphoresis, fatigue  and fever.  HENT: Negative for congestion, rhinorrhea and sneezing.   Eyes: Negative.   Respiratory: Negative for cough, chest tightness and shortness of breath.   Cardiovascular: Negative for chest pain and leg swelling.  Gastrointestinal: Negative for abdominal pain, blood in stool, diarrhea, nausea and vomiting.  Genitourinary: Negative for difficulty urinating, flank pain, frequency and hematuria.  Musculoskeletal: Negative for arthralgias and back pain.  Skin: Negative for rash.  Neurological: Negative for dizziness, speech difficulty, weakness, numbness and  headaches.  Psychiatric/Behavioral: Positive for self-injury and suicidal ideas. The patient is nervous/anxious.      Physical Exam Updated Vital Signs BP 102/68   Pulse 72   Temp 98.2 F (36.8 C) (Oral)   Resp 17   LMP 05/12/2016 (Approximate) Comment: She states she is often "irregular"  SpO2 95%   Physical Exam  Constitutional: She is oriented to person, place, and time. She appears well-developed and well-nourished.  Patient is tearful on exam  HENT:  Head: Normocephalic and atraumatic.  Mouth/Throat: Oropharynx is clear and moist.  No visible burns  Eyes: Pupils are equal, round, and reactive to light.  Neck: Normal range of motion. Neck supple.  Cardiovascular: Normal rate, regular rhythm and normal heart sounds.   Pulmonary/Chest: Effort normal and breath sounds normal. No respiratory distress. She has no wheezes. She has no rales. She exhibits no tenderness.  Abdominal: Soft. Bowel sounds are normal. There is no tenderness. There is no rebound and no guarding.  Musculoskeletal: Normal range of motion. She exhibits no edema.  Lymphadenopathy:    She has no cervical adenopathy.  Neurological: She is alert and oriented to person, place, and time.  Skin: Skin is warm and dry. No rash noted.  Psychiatric: She has a normal mood and affect.     ED Treatments / Results  Labs (all labs ordered are listed, but only abnormal results are displayed) Labs Reviewed  COMPREHENSIVE METABOLIC PANEL - Abnormal; Notable for the following:       Result Value   Glucose, Bld 116 (*)    Calcium 8.7 (*)    ALT 11 (*)    All other components within normal limits  CBC WITH DIFFERENTIAL/PLATELET - Abnormal; Notable for the following:    WBC 10.7 (*)    Hemoglobin 11.5 (*)    HCT 33.2 (*)    Neutro Abs 8.1 (*)    All other components within normal limits  ACETAMINOPHEN LEVEL - Abnormal; Notable for the following:    Acetaminophen (Tylenol), Serum <10 (*)    All other components  within normal limits  URINALYSIS, ROUTINE W REFLEX MICROSCOPIC - Abnormal; Notable for the following:    Hgb urine dipstick SMALL (*)    Leukocytes, UA SMALL (*)    All other components within normal limits  URINALYSIS, MICROSCOPIC (REFLEX) - Abnormal; Notable for the following:    Bacteria, UA RARE (*)    Squamous Epithelial / LPF 0-5 (*)    All other components within normal limits  ACETAMINOPHEN LEVEL - Abnormal; Notable for the following:    Acetaminophen (Tylenol), Serum <10 (*)    All other components within normal limits  SALICYLATE LEVEL  ETHANOL  RAPID URINE DRUG SCREEN, HOSP PERFORMED  PREGNANCY, URINE  I-STAT BETA HCG BLOOD, ED ([facility], [facility], [facility] ONLY)    EKG  EKG Interpretation  Date/Time:  Sunday August 01 2016 17:43:02 EST Ventricular Rate:  92 PR Interval:    QRS Duration: 71 QT Interval:  340 QTC Calculation: 421  R Axis:   87 Text Interpretation:  Sinus rhythm Borderline Q waves in lateral leads ST elev, probable normal early repol pattern since last tracing no significant change Confirmed by Bricelyn Freestone  MD, Jennyfer Nickolson ([id number]) on 08/01/2016 6:53:15 PM       Radiology No results found.  Procedures Procedures (including critical care time)  Medications Ordered in ED Medications  acetaminophen (TYLENOL) tablet 650 mg (not administered)  ARIPiprazole (ABILIFY) tablet 5 mg (not administered)  hydrOXYzine (ATARAX/VISTARIL) tablet 10 mg (not administered)  traZODone (DESYREL) tablet 50 mg (not administered)  venlafaxine XR (EFFEXOR-XR) 24 hr capsule 150 mg (not administered)     Initial Impression / Assessment and Plan / ED Course  I have reviewed the triage vital signs and the nursing notes.  Pertinent labs & imaging results that were available during my care of the patient were reviewed by me and considered in my medical decision making (see chart for details).  Clinical Course     I spoke with poison control. They recommend a 6 hour observation. And if  asymptomatic can medically clear. She's been monitored and has no sore throat or difficulty swallowing. No vomiting. No nausea. She's medically cleared and awaiting TTS evaluation.  Final Clinical Impressions(s) / ED Diagnoses   Final diagnoses:  Ingestion of bleach, intentional self-harm, initial encounter (HCC)  Depression, unspecified depression type    New Prescriptions New Prescriptions   No medications on file     Rolan Bucco, MD 08/01/16 2332

## 2016-08-01 NOTE — ED Notes (Signed)
Mother at bedside. No pain voiced by pt pillow given for comfort refused food and drink.
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
## 2016-08-01 NOTE — ED Notes (Signed)
No respiratory or acute distress noted alert and oriented x 3 call light in reach visitor at bedside call light in reach.

## 2016-08-01 NOTE — ED Triage Notes (Signed)
She c/o issues with depression and "suicidal thoughts". She cites a recent hiatus in her antipsychotic med. She tells me that at this time she is not having suicidal thoughts. She states she drank "a mouthful of "[employer]" toilet bowl cleaner in an attempt to harm herself; immediately after which she vomited x 1. After she vomited, her brother got her some water to drink, which she was able to drink with no further episodes of emesis. She is alert and in no distress.

## 2016-08-01 NOTE — ED Notes (Signed)
No respiratory or acute distress noted alert and oriented x 3 call light in reach no reaction to ingested liquid noted. Clear speech noted no drooling noted no n/v noted.

## 2016-08-01 NOTE — BH Assessment (Addendum)
Tele Assessment Note   Jo Haas is an 21 y.o. single female who presents to [facility] ED after ingesting a mouthful of "[employer]" toilet cleaner in a suicide attempt. Pt has been diagnosed with MDD and GAD and is in Psych IOP at [facility]. She reports she had an argument with her mother today prior to ingesting the cleaner. Pt reports she has experienced increased depressive symptoms and scales her current depression at 9/10. Pt reports symptoms including crying spells, social withdrawal, fatigue, decreased concentration, decreased appetite and feelings of hopelessness. Pt says she has not been motivated to get out of bed. She says she has experienced mood swings and recurring suicidal ideation. She reports a previous suicide attempt by overdosing on prescription medication. She denies current homicidal ideation or history of violence. She denies any history of psychotic symptoms. She denies any history of alcohol or substance abuse.  Pt identifies her primary stressor as concern about returning to school. Pt is a Consulting civil engineer at [employer] She lives with her mother and brother. Pt reports in December she ran out of Effexor and Abilify, experienced withdrawal and says even though she resumed the medication she has not felt better.  Pt is dressed in hospital gown, alert, oriented x4 with normal speech and normal motor behavior. Eye contact is good. Pt's mood is depressed and affect is congruent with mood. Thought process is coherent and relevant. There is no indication Pt is currently responding to internal stimuli or experiencing delusional thought content. Pt says she does not want to be readmitted to a psychiatric facility but would be willing to sign voluntarily if physician insisted.  See Pt's medical record for additional clinical history.   Diagnosis: Major Depressive Disorder, Recurrent, Severe Without Psychotic Features; Generalized Anxiety Disorder  Past Medical History:  Past Medical  History:  Diagnosis Date  . Anxiety   . Depression   . Multiple body piercings    nose, ears  . PTSD (post-traumatic stress disorder)   . Vision abnormalities    corrective lenses    Past Surgical History:  Procedure Laterality Date  . LAPAROSCOPIC CHOLECYSTECTOMY SINGLE PORT N/A 03/04/2014   Procedure: LAPAROSCOPIC CHOLECYSTECTOMY SINGLE PORT;  Surgeon: Ardeth Sportsman, MD;  Location: [facility] ORS;  Service: General;  Laterality: N/A;    Family History:  Family History  Problem Relation Age of Onset  . Depression Mother   . Varicose Veins Mother   . Asthma Brother   . Alcohol abuse Father   . Anxiety disorder Father   . COPD Paternal Grandmother     Social History:  reports that she has never smoked. She has never used smokeless tobacco. She reports that she does not drink alcohol or use drugs.  Additional Social History:  Alcohol / Drug Use Pain Medications: Pt denies Prescriptions: Pt denies Over the Counter: Pt denies History of alcohol / drug use?: No history of alcohol / drug abuse Longest period of sobriety (when/how long): NA  CIWA: CIWA-Ar BP: 102/68 Pulse Rate: 72 COWS:    PATIENT STRENGTHS: (choose at least two) Ability for insight Average or above average intelligence Capable of independent living Communication skills Financial means General fund of knowledge Motivation for treatment/growth Physical Health Supportive family/friends  Allergies:  Allergies  Allergen Reactions  . Amoxicillin Swelling    .Marland KitchenHas patient had a PCN reaction causing immediate rash, facial/tongue/throat swelling, SOB or lightheadedness with hypotension: UNKNOWN Has patient had a PCN reaction causing severe rash involving mucus membranes or skin necrosis:  UNKNOWN Has patient had a PCN reaction that required hospitalization UNKNOWN Has patient had a PCN reaction occurring within the last 10 years: Yes - infant allergy If all of the above answers are "NO", then may proceed with  Cephalosporin use.   . Pollen Extract Swelling    Food containing pollen    Home Medications:  (Not in a hospital admission)  OB/GYN Status:  Patient's last menstrual period was 05/12/2016 (approximate).  General Assessment Data Location of Assessment: [facility] TTS Assessment: In system Is this a Tele or Face-to-Face Assessment?: Tele Assessment Is this an Initial Assessment or a Re-assessment for this encounter?: Initial Assessment Marital status: Single Maiden name: NA Is patient pregnant?: No Pregnancy Status: No Living Arrangements: Parent, Other relatives (Lives with mother, brother) Can pt return to current living arrangement?: Yes Admission Status: Voluntary Is patient capable of signing voluntary admission?: Yes Referral Source: Self/Family/Friend Insurance type: [facility]     Crisis Care Plan Living Arrangements: Parent, Other relatives (Lives with mother, brother) Armed forces operational officerLegal Guardian: Other: (Self) Name of Psychiatrist: Dr. Caryl NeverAgarwall Name of Therapist: [facility]  Education Status Is patient currently in school?: Yes Current Grade: Freshman college Highest grade of school patient has completed: 12 Name of school: [employer] Contact person: NA  Risk to self with the past 6 months Suicidal Ideation: Yes-Currently Present Has patient been a risk to self within the past 6 months prior to admission? : Yes Suicidal Intent: Yes-Currently Present Has patient had any suicidal intent within the past 6 months prior to admission? : Yes Is patient at risk for suicide?: Yes Suicidal Plan?: Yes-Currently Present Has patient had any suicidal plan within the past 6 months prior to admission? : Yes Specify Current Suicidal Plan: Pt ingested cleaning solution in suicide attempt Access to Means: Yes Specify Access to Suicidal Means: Pt ingested cleaning solution What has been your use of drugs/alcohol within the last 12 months?: Pt denies Previous Attempts/Gestures: Yes How many  times?: 2 (Pt reports she overdosed on medication in the past) Other Self Harm Risks: None Triggers for Past Attempts: Unknown Intentional Self Injurious Behavior: None Family Suicide History: No Recent stressful life event(s): Other (Comment) (Worried about school performance) Persecutory voices/beliefs?: No Depression: Yes Depression Symptoms: Despondent, Tearfulness, Isolating, Fatigue, Guilt, Loss of interest in usual pleasures, Feeling worthless/self pity Substance abuse history and/or treatment for substance abuse?: No Suicide prevention information given to non-admitted patients: Not applicable  Risk to Others within the past 6 months Homicidal Ideation: No Does patient have any lifetime risk of violence toward others beyond the six months prior to admission? : No Thoughts of Harm to Others: No Current Homicidal Intent: No Current Homicidal Plan: No Access to Homicidal Means: No Identified Victim: None History of harm to others?: No Assessment of Violence: None Noted Violent Behavior Description: Pt denies history of violence Does patient have access to weapons?: No Criminal Charges Pending?: No Does patient have a court date: No Is patient on probation?: No  Psychosis Hallucinations: None noted Delusions: None noted  Mental Status Report Appearance/Hygiene: In hospital gown Eye Contact: Good Motor Activity: Unremarkable Speech: Logical/coherent Level of Consciousness: Alert Mood: Depressed Affect: Depressed Anxiety Level: Panic Attacks Panic attack frequency: Once per month Most recent panic attack: Several weeks ago Thought Processes: Coherent, Relevant Judgement: Unimpaired Orientation: Person, Place, Time, Situation, Appropriate for developmental age Obsessive Compulsive Thoughts/Behaviors: None  Cognitive Functioning Concentration: Normal Memory: Recent Intact, Remote Intact IQ: Average Insight: Good Impulse Control: Fair Appetite: Good  Weight Loss:  0 Weight Gain: 0 Sleep: No Change Total Hours of Sleep: 8 Vegetative Symptoms: None  ADLScreening [facility] Assessment Services) Patient's cognitive ability adequate to safely complete daily activities?: Yes Patient able to express need for assistance with ADLs?: Yes Independently performs ADLs?: Yes (appropriate for developmental age)  Prior Inpatient Therapy Prior Inpatient Therapy: Yes Prior Therapy Dates: 06/01/16-06/07/16 Prior Therapy Facilty/Provider(s): [facility] Reason for Treatment: MDD, GAD  Prior Outpatient Therapy Prior Outpatient Therapy: Yes Prior Therapy Dates: 05/2016-07/2016 Prior Therapy Facilty/Provider(s): [facility] Reason for Treatment: MDD, GAD Does patient have an ACCT team?: No Does patient have Intensive In-House Services?  : No Does patient have [facility] services? : No Does patient have P4CC services?: No  ADL Screening (condition at time of admission) Patient's cognitive ability adequate to safely complete daily activities?: Yes Is the patient deaf or have difficulty hearing?: No Does the patient have difficulty seeing, even when wearing glasses/contacts?: No Does the patient have difficulty concentrating, remembering, or making decisions?: No Patient able to express need for assistance with ADLs?: Yes Does the patient have difficulty dressing or bathing?: No Independently performs ADLs?: Yes (appropriate for developmental age) Does the patient have difficulty walking or climbing stairs?: No Weakness of Legs: None Weakness of Arms/Hands: None  Home Assistive Devices/Equipment Home Assistive Devices/Equipment: None    Abuse/Neglect Assessment (Assessment to be complete while patient is alone) Physical Abuse: Denies Verbal Abuse: Yes, past (Comment) Sexual Abuse: Denies Exploitation of patient/patient's resources: Denies Self-Neglect: Denies     Merchant navy officer (For Healthcare) Does Patient Have a Medical Advance Directive?: No Would patient  like information on creating a medical advance directive?: No - Patient declined    Additional Information 1:1 In Past 12 Months?: No CIRT Risk: No Elopement Risk: No Does patient have medical clearance?: Yes     Disposition: Binnie Rail, AC at [facility], confirmed adult unit is at capacity but a bed should be available tomorrow. Gave clinical report to Nira Conn, NP who said Pt meets criteria for inpatient psychiatric treatment. Notified Rolan Bucco, MD and Arlys John, RN.  Disposition Initial Assessment Completed for this Encounter: Yes Disposition of Patient: Inpatient treatment program Type of inpatient treatment program: Adult   Pamalee Leyden, [facility], [facility] Triage Specialist [phone]   Pamalee Leyden 08/01/2016 11:31 PM

## 2016-08-01 NOTE — ED Notes (Signed)
Called poison control and was advised that Dr Fredderick PhenixBelfi had already made contact. Recommendations were to repeat 4hr acetaminophen level and observe for 6 hrs post ingestion. Watch for seizures d/t pt rx'd meds

## 2016-08-02 ENCOUNTER — Encounter (HOSPITAL_COMMUNITY): Payer: Self-pay

## 2016-08-02 ENCOUNTER — Inpatient Hospital Stay (HOSPITAL_COMMUNITY)
Admission: AD | Admit: 2016-08-02 | Discharge: 2016-08-06 | DRG: 885 | Disposition: A | Discharge status: Home / Self Care | Payer: PRIVATE HEALTH INSURANCE | Source: Intra-hospital | Attending: Psychiatry | Admitting: Psychiatry

## 2016-08-02 DIAGNOSIS — Z818 Family history of other mental and behavioral disorders: Secondary | ICD-10-CM | POA: Diagnosis not present

## 2016-08-02 DIAGNOSIS — Z811 Family history of alcohol abuse and dependence: Secondary | ICD-10-CM

## 2016-08-02 DIAGNOSIS — F332 Major depressive disorder, recurrent severe without psychotic features: Secondary | ICD-10-CM | POA: Diagnosis not present

## 2016-08-02 DIAGNOSIS — T1491XA Suicide attempt, initial encounter: Secondary | ICD-10-CM | POA: Diagnosis not present

## 2016-08-02 DIAGNOSIS — T65892A Toxic effect of other specified substances, intentional self-harm, initial encounter: Secondary | ICD-10-CM | POA: Diagnosis not present

## 2016-08-02 DIAGNOSIS — F4310 Post-traumatic stress disorder, unspecified: Secondary | ICD-10-CM | POA: Diagnosis present

## 2016-08-02 DIAGNOSIS — Z915 Personal history of self-harm: Secondary | ICD-10-CM

## 2016-08-02 DIAGNOSIS — T5492XA Toxic effect of unspecified corrosive substance, intentional self-harm, initial encounter: Secondary | ICD-10-CM | POA: Diagnosis not present

## 2016-08-02 DIAGNOSIS — Z79899 Other long term (current) drug therapy: Secondary | ICD-10-CM | POA: Diagnosis not present

## 2016-08-02 DIAGNOSIS — F429 Obsessive-compulsive disorder, unspecified: Secondary | ICD-10-CM | POA: Diagnosis present

## 2016-08-02 DIAGNOSIS — Z825 Family history of asthma and other chronic lower respiratory diseases: Secondary | ICD-10-CM | POA: Diagnosis not present

## 2016-08-02 DIAGNOSIS — Z880 Allergy status to penicillin: Secondary | ICD-10-CM | POA: Diagnosis not present

## 2016-08-02 DIAGNOSIS — F411 Generalized anxiety disorder: Secondary | ICD-10-CM | POA: Diagnosis present

## 2016-08-02 MED ORDER — ALUM & MAG HYDROXIDE-SIMETH 200-200-20 MG/5ML PO SUSP: 30.0000 mL | ORAL | Status: DC | PRN

## 2016-08-02 MED ORDER — MAGNESIUM HYDROXIDE 400 MG/5ML PO SUSP: 30.0000 mL | Freq: Every day | ORAL | Status: DC | PRN

## 2016-08-02 MED ORDER — HYDROXYZINE HCL 10 MG PO TABS
10.0000 mg | ORAL_TABLET | Freq: Three times a day (TID) | ORAL | Status: DC | PRN
  Filled 2016-08-02: qty 10

## 2016-08-02 MED ORDER — TRAZODONE HCL 50 MG PO TABS
50.0000 mg | ORAL_TABLET | Freq: Every evening | ORAL | Status: DC | PRN
  Administered 2016-08-03 – 2016-08-05 (×3): 50 mg via ORAL
  Filled 2016-08-02 (×3): qty 1
  Filled 2016-08-02: qty 7

## 2016-08-02 MED ORDER — VENLAFAXINE HCL ER 150 MG PO CP24
150.0000 mg | ORAL_CAPSULE | Freq: Every day | ORAL | Status: DC
  Administered 2016-08-03: 150 mg via ORAL
  Filled 2016-08-02 (×3): qty 1

## 2016-08-02 MED ORDER — ARIPIPRAZOLE 5 MG PO TABS
5.0000 mg | ORAL_TABLET | Freq: Every day | ORAL | Status: DC
  Administered 2016-08-02 – 2016-08-05 (×4): 5 mg via ORAL
  Filled 2016-08-02: qty 7
  Filled 2016-08-02 (×5): qty 1

## 2016-08-02 NOTE — BH Assessment (Signed)
[facility]HH Assessment Progress Note  Per Nira ConnJason Berry, NP, this pt requires psychiatric hospitalization.  Berneice Heinrichina Tate, RN, [facility]  Pt presents under IVC initiated by her mother, and upheld by Dr Jannifer FranklinAkintayo, and [facility].  Pt's nurse has been notified, and agrees to call report to [phone].  Pt is to be transported via Patent examinerlaw enforcement.   Doylene Canninghomas Aissatou Fronczak, MA Triage Specialist [phone]

## 2016-08-02 NOTE — [facility] Group Notes (Signed)
B[facility] LCSW Group Therapy  08/02/2016 1:15pm  Type of Therapy:  Group Therapy vercoming Obstacles  Participation Level:  Minimal  Participation Quality:  Withdrawn  Affect:  Flat  Cognitive:  Appropriate and Oriented  Insight:  Unable to assess  Engagement in Therapy:  Minimal  Modes of Intervention:  Discussion, Exploration, Problem-solving and Support  Description of Group:   In this group patients will be encouraged to explore what they see as obstacles to their own wellness and recovery. They will be guided to discuss their thoughts, feelings, and behaviors related to these obstacles. The group will process together ways to cope with barriers, with attention given to specific choices patients can make. Each patient will be challenged to identify changes they are motivated to make in order to overcome their obstacles. This group will be process-oriented, with patients participating in exploration of their own experiences as well as giving and receiving support and challenge from other group members.  Summary of Patient Progress: Pt did not participate in group discussion despite prompting. Pt remained withdrawn in demeanor.    Therapeutic Modalities:   Cognitive Behavioral Therapy Solution Focused Therapy Motivational Interviewing Relapse Prevention Therapy   Vernie ShanksLauren Clebert Wenger, LCSW 08/02/2016 3:23 PM

## 2016-08-02 NOTE — Progress Notes (Signed)
Writer introduced self to pt. Orders acknowledged. Pt observed interacting in the dayroom. 15 minute checks performed for safety.

## 2016-08-02 NOTE — Tx Team (Signed)
Initial Treatment Plan 08/02/2016 1:35 PM [name] ONG:[id number]    PATIENT STRESSORS: Educational concerns Marital or family conflict Medication change or noncompliance   PATIENT STRENGTHS: Wellsite geologistCommunication skills General fund of knowledge Motivation for treatment/growth Physical Health   PATIENT IDENTIFIED PROBLEMS: Depression  Anxiety  Suicidal ideation/attempt  "Finding motivation"  "Coping with my depression"  "I don't want to feel like a failure"           DISCHARGE CRITERIA:  Improved stabilization in mood, thinking, and/or behavior Verbal commitment to aftercare and medication compliance  PRELIMINARY DISCHARGE PLAN: Outpatient therapy Medication management  PATIENT/FAMILY INVOLVEMENT: This treatment plan has been presented to and reviewed with the patient, [name].  The patient and family have been given the opportunity to ask questions and make suggestions.  Levin BaconHeather V Donnavan Covault, RN 08/02/2016, 1:35 PM
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
## 2016-08-03 DIAGNOSIS — Z818 Family history of other mental and behavioral disorders: Secondary | ICD-10-CM

## 2016-08-03 DIAGNOSIS — Z811 Family history of alcohol abuse and dependence: Secondary | ICD-10-CM

## 2016-08-03 DIAGNOSIS — Z825 Family history of asthma and other chronic lower respiratory diseases: Secondary | ICD-10-CM

## 2016-08-03 DIAGNOSIS — Z91030 Bee allergy status: Secondary | ICD-10-CM

## 2016-08-03 DIAGNOSIS — T1491XA Suicide attempt, initial encounter: Secondary | ICD-10-CM

## 2016-08-03 DIAGNOSIS — T65892A Toxic effect of other specified substances, intentional self-harm, initial encounter: Secondary | ICD-10-CM

## 2016-08-03 DIAGNOSIS — Z79899 Other long term (current) drug therapy: Secondary | ICD-10-CM

## 2016-08-03 DIAGNOSIS — Z888 Allergy status to other drugs, medicaments and biological substances status: Secondary | ICD-10-CM

## 2016-08-03 DIAGNOSIS — F332 Major depressive disorder, recurrent severe without psychotic features: Principal | ICD-10-CM

## 2016-08-03 MED ORDER — VENLAFAXINE HCL ER 37.5 MG PO CP24
37.5000 mg | ORAL_CAPSULE | Freq: Every day | ORAL | Status: DC
  Administered 2016-08-04 – 2016-08-06 (×3): 37.5 mg via ORAL
  Filled 2016-08-03 (×4): qty 1

## 2016-08-03 MED ORDER — BUPROPION HCL ER (XL) 150 MG PO TB24
150.0000 mg | ORAL_TABLET | Freq: Every day | ORAL | Status: DC
  Administered 2016-08-03 – 2016-08-06 (×4): 150 mg via ORAL
  Filled 2016-08-03 (×4): qty 1
  Filled 2016-08-03: qty 7
  Filled 2016-08-03: qty 1

## 2016-08-03 NOTE — [facility] Group Notes (Signed)
B[facility] LCSW Group Therapy 08/03/2016 1:15 PM  Type of Therapy: Group Therapy- Feelings about Diagnosis  Participation Level: Active   Participation Quality:  Appropriate  Affect:  Appropriate  Cognitive: Alert and Oriented   Insight:  Developing   Engagement in Therapy: Developing/Improving and Engaged   Modes of Intervention: Clarification, Confrontation, Discussion, Education, Exploration, Limit-setting, Orientation, Problem-solving, Rapport Building, Dance movement psychotherapisteality Testing, Socialization and Support  Description of Group:   This group will allow patients to explore their thoughts and feelings about diagnoses they have received. Patients will be guided to explore their level of understanding and acceptance of these diagnoses. Facilitator will encourage patients to process their thoughts and feelings about the reactions of others to their diagnosis, and will guide patients in identifying ways to discuss their diagnosis with significant others in their lives. This group will be process-oriented, with patients participating in exploration of their own experiences as well as giving and receiving support and challenge from other group members.  Summary of Progress/Problems:  Pt was active in discussion today and identified severe fatigue as an obstacle to recovery. Pt reports that she "just wants to feel like doing something" rather than laying in bed. Pt interacted well with peers and was receptive to feedback.   Therapeutic Modalities:   Cognitive Behavioral Therapy Solution Focused Therapy Motivational Interviewing Relapse Prevention Therapy  Jo ShanksLauren Xzayvion Vaeth, LCSW 08/03/2016 4:30 PM

## 2016-08-03 NOTE — [facility] Suicide Risk Assessment (Signed)
[facility]   Nursing information obtained from:   patient and chart  Demographic factors:   21 year old single female, lives with mother  Current Mental Status:   see below Loss Factors:   argument with mother  Historical Factors:   depression, prior admissions  Risk Reduction Factors:   resilience   Total Time spent with patient: 45 minutes Principal Problem:  MDD  Diagnosis:   Patient Active Problem List   Diagnosis Date Noted  . MDD (major depressive disorder), recurrent severe, without psychosis (HCC) [F33.2] 08/02/2016  . MDD (major depressive disorder), recurrent episode, severe (HCC) [F33.2] 06/01/2016  . GAD (generalized anxiety disorder) [F41.1] 04/10/2015  . Severe episode of recurrent major depressive disorder, without psychotic features (HCC) [F33.2] 04/10/2015  . PTSD (post-traumatic stress disorder) [F43.10] 04/10/2015  . Social anxiety disorder [F40.10] 04/10/2015  . OCD (obsessive compulsive disorder) [F42.9] 04/10/2015  . Routine general medical examination at a health care facility [Z00.00] 04/10/2015  . Generalized anxiety disorder [F41.1] 02/08/2014  . Chronic cholecystitis with calculus [K80.10] 01/23/2014  . Major depressive disorder, recurrent episode (HCC) [F33.9] 10/27/2011    Continued Clinical Symptoms:  Alcohol Use Disorder Identification Test Final Score (AUDIT): 0 The "Alcohol Use Disorders Identification Test", Guidelines for Use in Primary Care, Second Edition.  World Science writerHealth [employer] [facility](WHO). Score between 0-7:  no or low risk or alcohol related problems. Score between 8-15:  moderate risk of alcohol related problems. Score between 16-19:  high risk of alcohol related problems. Score 20 or above:  warrants further diagnostic evaluation for alcohol dependence and treatment.   CLINICAL FACTORS:  21 year old single female, known to Clinical research associatewriter /unit from prior psychiatric admission in late 2017. History of depression. Reports  ongoing depression and status post impulsive ingestion of household chemical in the context of argument with her mother.      Psychiatric Specialty Exam: Physical Exam  ROS  Blood pressure 105/62, pulse (!) 101, temperature 97.8 F (36.6 C), temperature source Oral, resp. rate 18, height 5\' 1"  (1.549 m), weight 63 kg (139 lb), last menstrual period 05/12/2016, SpO2 100 %.Body mass index is 26.26 kg/m.   see admit note MSE    COGNITIVE FEATURES THAT CONTRIBUTE TO RISK:  Closed-mindedness and Loss of executive function    SUICIDE RISK:   Moderate:  Frequent suicidal ideation with limited intensity, and duration, some specificity in terms of plans, no associated intent, good self-control, limited dysphoria/symptomatology, some risk factors present, and identifiable protective factors, including available and accessible social support.   PLAN OF CARE: Patient will be admitted to inpatient psychiatric unit for stabilization and safety. Will provide and encourage milieu participation. Provide medication management and maked adjustments as needed.  Will follow daily.    I certify that inpatient services furnished can reasonably be expected to improve the patient's condition.  Nehemiah MassedOBOS, FERNANDO, MD 08/03/2016, 3:23 PM

## 2016-08-03 NOTE — Progress Notes (Signed)
D: Pt was in the day room upon initial approach.  Pt presents with depressed affect and mood.  Reports goal today was to "see the psychiatrist and talk about my medication and I got to do that."  Pt reports she also "colored, read" today.  Pt denies SI/HI, denies hallucinations, denies pain.  Pt has been visible in milieu interacting with peers and staff cautiously.  Pt attended evening group.   A: Introduced self to pt.  Actively listened to pt and offered support and encouragement. Medication administered per order.  PRN medication administered for sleep. R: Pt is safe on the unit.  Pt is compliant with medications.  Pt verbally contracts for safety.  Will continue to monitor and assess.

## 2016-08-03 NOTE — Plan of Care (Signed)
Problem: Medication: Goal: Compliance with prescribed medication regimen will improve Outcome: Progressing Pt was compliant with scheduled medication tonight.    

## 2016-08-03 NOTE — Progress Notes (Signed)
D: Pt was in the day room upon initial approach.  Pt presents with depressed affect and mood.  Her goal is "trying to be functional."  Pt denies SI/HI, denies hallucinations, denies pain.  Pt has been visible in milieu interacting with peers and staff cautiously.  Pt attended evening group.   A: Introduced self to pt.  Actively listened to pt and offered support and encouragement. Medication administered per order.   R: Pt is safe on the unit.  Pt is compliant with medication.  Pt verbally contracts for safety.  Will continue to monitor and assess.
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
## 2016-08-03 NOTE — [facility] Group Notes (Signed)
[facility] Group Notes:  (Nursing/MHT/Case Management/Adjunct)  Date:  08/03/2016  Time:  11:20 AM   Type of Therapy:  Psychoeducational Skills  Participation Level:  Active  Participation Quality:  Appropriate  Affect:  Appropriate  Cognitive:  Appropriate  Insight:  Appropriate  Engagement in Group:  Engaged  Modes of Intervention:  Problem-solving  Summary of Progress/Problems: Goal is to "see psychiatrist, depression and SI coping skills."   Jo Haas PunchesJane O Myrka Sylva 08/03/2016, 11:20 AM

## 2016-08-03 NOTE — Tx Team (Signed)
Interdisciplinary Treatment and Diagnostic Plan Update  08/03/2016 Time of Session: 4:29 PM  Jo Haas MRN: [id number]  Principal Diagnosis: MDD (major depressive disorder), recurrent severe, without psychosis ([place])  Secondary Diagnoses: Principal Problem:   MDD (major depressive disorder), recurrent severe, without psychosis ([place])   Current Medications:  Current Facility-Administered Medications  Medication Dose Route Frequency Provider Last Rate Last Dose  . alum & mag hydroxide-simeth (MAALOX/MYLANTA) 200-200-20 MG/5ML suspension 30 mL  30 mL Oral Q4H PRN Patrecia Pour, NP      . ARIPiprazole (ABILIFY) tablet 5 mg  5 mg Oral QHS Patrecia Pour, NP   5 mg at 08/02/16 2106  . buPROPion (WELLBUTRIN XL) 24 hr tablet 150 mg  150 mg Oral Daily Myer Peer Cobos, MD      . hydrOXYzine (ATARAX/VISTARIL) tablet 10 mg  10 mg Oral TID PRN Patrecia Pour, NP      . magnesium hydroxide (MILK OF MAGNESIA) suspension 30 mL  30 mL Oral Daily PRN Patrecia Pour, NP      . traZODone (DESYREL) tablet 50 mg  50 mg Oral QHS PRN Patrecia Pour, NP      . Derrill Memo ON 08/04/2016] venlafaxine XR (EFFEXOR-XR) 24 hr capsule 37.5 mg  37.5 mg Oral Q breakfast Jenne Campus, MD        PTA Medications: Prescriptions Prior to Admission  Medication Sig Dispense Refill Last Dose  . ARIPiprazole (ABILIFY) 5 MG tablet Take 1 tablet (5 mg total) by mouth at bedtime. 30 tablet 2 07/30/2016 at Unknown time  . hydrOXYzine (ATARAX/VISTARIL) 10 MG tablet TAKE 1 TABLET (10 MG TOTAL) BY MOUTH 3 (THREE) TIMES DAILY AS NEEDED FOR ANXIETY (ANXIETY).  2 Past Month at Unknown time  . traZODone (DESYREL) 50 MG tablet TAKE 1 TABLET BY MOUTH EVERY DAY AT BEDTIME AS NEEDED FOR SLEEP  0 Past Month at Unknown time  . venlafaxine XR (EFFEXOR-XR) 150 MG 24 hr capsule Take 1 capsule (150 mg total) by mouth daily with breakfast. 30 capsule 2 07/30/2016    Treatment Modalities: Medication Management, Group therapy, Case management,  1 to  1 session with clinician, Psychoeducation, Recreational therapy.  Patient Stressors: Educational concerns Marital or family conflict Medication change or noncompliance  Patient Strengths: Network engineer for treatment/growth Physical Health  Physician Treatment Plan for Primary Diagnosis: MDD (major depressive disorder), recurrent severe, without psychosis ([place]) Long Term Goal(s): Improvement in symptoms so as ready for discharge  Short Term Goals: Ability to verbalize feelings will improve Ability to disclose and discuss suicidal ideas Ability to demonstrate self-control will improve Ability to identify and develop effective coping behaviors will improve Ability to maintain clinical measurements within normal limits will improve Ability to verbalize feelings will improve Ability to disclose and discuss suicidal ideas Ability to demonstrate self-control will improve Ability to identify and develop effective coping behaviors will improve Ability to maintain clinical measurements within normal limits will improve  Medication Management: Evaluate patient's response, side effects, and tolerance of medication regimen.  Therapeutic Interventions: 1 to 1 sessions, Unit Group sessions and Medication administration.  Evaluation of Outcomes: Not Met  Physician Treatment Plan for Secondary Diagnosis: Principal Problem:   MDD (major depressive disorder), recurrent severe, without psychosis ([place])   Long Term Goal(s): Improvement in symptoms so as ready for discharge  Short Term Goals: Ability to verbalize feelings will improve Ability to disclose and discuss suicidal ideas Ability to demonstrate self-control will improve Ability to  identify and develop effective coping behaviors will improve Ability to maintain clinical measurements within normal limits will improve Ability to verbalize feelings will improve Ability to disclose and discuss  suicidal ideas Ability to demonstrate self-control will improve Ability to identify and develop effective coping behaviors will improve Ability to maintain clinical measurements within normal limits will improve  Medication Management: Evaluate patient's response, side effects, and tolerance of medication regimen.  Therapeutic Interventions: 1 to 1 sessions, Unit Group sessions and Medication administration.  Evaluation of Outcomes: Not Met   RN Treatment Plan for Primary Diagnosis: MDD (major depressive disorder), recurrent severe, without psychosis ([place]) Long Term Goal(s): Knowledge of disease and therapeutic regimen to maintain health will improve  Short Term Goals: Ability to verbalize feelings will improve, Ability to disclose and discuss suicidal ideas and Ability to identify and develop effective coping behaviors will improve  Medication Management: RN will administer medications as ordered by provider, will assess and evaluate patient's response and provide education to patient for prescribed medication. RN will report any adverse and/or side effects to prescribing provider.  Therapeutic Interventions: 1 on 1 counseling sessions, Psychoeducation, Medication administration, Evaluate responses to treatment, Monitor vital signs and CBGs as ordered, Perform/monitor CIWA, COWS, AIMS and Fall Risk screenings as ordered, Perform wound care treatments as ordered.  Evaluation of Outcomes: Not Met   LCSW Treatment Plan for Primary Diagnosis: MDD (major depressive disorder), recurrent severe, without psychosis ([place]) Long Term Goal(s): Safe transition to appropriate next level of care at discharge, Engage patient in therapeutic group addressing interpersonal concerns.  Short Term Goals: Engage patient in aftercare planning with referrals and resources, Identify triggers associated with mental health/substance abuse issues and Increase skills for wellness and recovery  Therapeutic  Interventions: Assess for all discharge needs, 1 to 1 time with Social worker, Explore available resources and support systems, Assess for adequacy in community support network, Educate family and significant other(s) on suicide prevention, Complete Psychosocial Assessment, Interpersonal group therapy.  Evaluation of Outcomes: Not Met   Progress in Treatment: Attending groups: Pt is new to milieu, continuing to assess  Participating in groups: Pt is new to milieu, continuing to assess  Taking medication as prescribed: Yes, MD continues to assess for medication changes as needed Toleration medication: Yes, no side effects reported at this time Family/Significant other contact made: No, CSW attempting to make contact with mom Patient understands diagnosis: Continuing to assess Discussing patient identified problems/goals with staff: Yes Medical problems stabilized or resolved: Yes Denies suicidal/homicidal ideation: No, endorses passive SI Issues/concerns per patient self-inventory: None Other: N/A  New problem(s) identified: None identified at this time.   New Short Term/Long Term Goal(s): None identified at this time.   Discharge Plan or Barriers: Pt will return home and follow-up with outpatient services.   Reason for Continuation of Hospitalization: Anxiety Depression Medication stabilization Suicidal ideation  Estimated Length of Stay: 3-5 days  Attendees: Patient: 08/03/2016  4:29 PM  Physician: Dr. Parke Poisson 08/03/2016  4:29 PM  Nursing: Evans Lance, RN 08/03/2016  4:29 PM  RN Care Manager: Lars Pinks, RN 08/03/2016  4:29 PM  Social Worker: Adriana Reams, LCSW; [place], LCSW 08/03/2016  4:29 PM  Recreational Therapist:  08/03/2016  4:29 PM  Other: Lindell Spar, NP; Samuel Jester, NP 08/03/2016  4:29 PM  Other:  08/03/2016  4:29 PM  Other: 08/03/2016  4:29 PM    Scribe for Treatment Team: Gladstone Lighter, LCSW 08/03/2016 4:29 PM

## 2016-08-03 NOTE — Progress Notes (Signed)
Recreation Therapy Notes  Animal-Assisted Activity (AAA) Program Checklist/Progress Notes Patient Eligibility Criteria Checklist & Daily Group note for Rec TxIntervention  Date: 01.09.2018 Time: 2:45pm Location: [address] Dayroom    AAA/T Program Assumption of Risk Form signed by Patient/ or Parent Legal Guardian Yes  Patient is free of allergies or sever asthma Yes  Patient reports no fear of animals Yes  Patient reports no history of cruelty to animals Yes  Patient understands his/her participation is voluntary Yes  Patient washes hands before animal contact Yes  Patient washes hands after animal contact Yes  Behavioral Response: Engaged, Appropriate   Education:Hand Washing, Appropriate Animal Interaction   Education Outcome: Acknowledges education.   Clinical Observations/Feedback: Patient attended session and interacted appropriately with therapy dog and peers.   Mel Langan L Mubashir Mallek, LRT/CTRS        Kemoni Ortega L 08/03/2016 3:13 PM 

## 2016-08-03 NOTE — Progress Notes (Signed)
Adult Psychoeducational Group Note  Date:  08/03/2016 Time:  12:49 AM  Group Topic/Focus:  Wrap-Up Group:   The focus of this group is to help patients review their daily goal of treatment and discuss progress on daily workbooks.   Participation Level:  Active  Participation Quality:  Appropriate  Affect:  Appropriate  Cognitive:  Alert  Insight: Appropriate  Engagement in Group:  Engaged  Modes of Intervention:  Discussion  Additional Comments:  On a scale between 1-10, 91=worse, 10=best) patient rates her day a 7. Patient's goal for today was to meet with the doctor. Patient did not meet goal but plan to talk with the doctor tomorrow. Even Budlong L Keonte Daubenspeck 08/03/2016, 12:49 AM

## 2016-08-03 NOTE — Progress Notes (Addendum)
DAR NOTE: Pt present with flat affect and depressed mood in the unit. Pt has been in the day room interacting with peers. Pt attended group and participated. Pt denies physical pain, took all his meds as scheduled. As per self inventory, pt had a poor night sleep, fair appetite, normal energy, and good concentration. Pt rate depression at 5, hopeless ness at 5, and anxiety at 5. Pt's safety ensured with 15 minute and environmental checks. Pt currently denies SI/HI and A/V hallucinations. Pt verbally agrees to seek staff if SI/HI or A/VH occurs and to consult with staff before acting on these thoughts. Will continue POC.

## 2016-08-04 LAB — VITAMIN B12: Vitamin B-12: 291 pg/mL (ref 180–914)

## 2016-08-04 LAB — FOLATE: Folate: 28.8 ng/mL (ref >5.9)

## 2016-08-04 NOTE — Progress Notes (Signed)
Recreation Therapy Notes  Date: 08/04/16 Time: 0930 Location: [address] Group Room  Group Topic: Stress Management  Goal Area(s) Addresses:  Patient will verbalize importance of using healthy stress management.  Patient will identify positive emotions associated with healthy stress management.   Intervention: Stress Management  Activity :  Letting Go Meditation.  LRT introduced the stress management technique of meditation.  LRT played a meditation from the Calm app so patients could engage in the activity.  Patients were to follow along with the meditation to participate.  Education:  Stress Management, Discharge Planning.   Education Outcome: Acknowledges edcuation/In group clarification offered/Needs additional education  Clinical Observations/Feedback: Pt did not attend group.   Caroll RancherMarjette Earsie Humm, LRT/CTRS         Caroll RancherLindsay, Taunya Goral A 08/04/2016 12:35 PM

## 2016-08-04 NOTE — Progress Notes (Signed)
DAR NOTE: Pt has been isolating herself, stayed in the room most of the day. Pt report fair night sleep fair appetite, normal energy level and good concentration. Pt took his meds as offered, rates depression at 4, hopelessness at 4, and anxiety at 3.  Denies SI/HI, AVH and verbally contracted for safety. State the goal is "staying positive".  Support and encouragement offered. Maintained on Q15 min checks, no complain offered, will continue to monitor.

## 2016-08-04 NOTE — Progress Notes (Signed)
Adult Psychoeducational Group Note  Date:  08/04/2016 Time:  12:10 AM  Group Topic/Focus:  Wrap-Up Group:   The focus of this group is to help patients review their daily goal of treatment and discuss progress on daily workbooks.   Participation Level:  Active  Participation Quality:  Appropriate  Affect:  Appropriate  Cognitive:  Alert  Insight: Appropriate  Engagement in Group:  Engaged  Modes of Intervention:  Discussion  Additional Comments:  On a scale between 1-10, (1=worse, 10=best) patient rates her day a 7. Patient's goal for today was to see the Psychiatrist. Patient met goal. Rakeya Glab L Emalynn Clewis 08/04/2016, 12:10 AM

## 2016-08-04 NOTE — Progress Notes (Signed)
[facility] MD Progress Note  08/04/2016 1:28 PM Jo Haas  MRN:  [id number] Subjective:  "I'm ok.  Just trying to get over the sleep medication." Objective:  Patient states that she is ok.  Still with flat affect and reports saddened mood.  She states that the sleep medication is wearing off.  States that she is on Effexor and Abilify and that Wellbutrin was added.  She is hopeful that new medication will help with her motivation and attention.    Principal Problem: MDD (major depressive disorder), recurrent severe, without psychosis ([facility]) Diagnosis:   Patient Active Problem List   Diagnosis Date Noted  . MDD (major depressive disorder), recurrent severe, without psychosis ([facility]) [F33.2] 08/02/2016    Priority: High  . MDD (major depressive disorder), recurrent episode, severe ([facility]) [F33.2] 06/01/2016  . GAD (generalized anxiety disorder) [F41.1] 04/10/2015  . Severe episode of recurrent major depressive disorder, without psychotic features ([facility]) [F33.2] 04/10/2015  . PTSD (post-traumatic stress disorder) [F43.10] 04/10/2015  . Social anxiety disorder [F40.10] 04/10/2015  . OCD (obsessive compulsive disorder) [F42.9] 04/10/2015  . Routine general medical examination at a health care facility [Z00.00] 04/10/2015  . Generalized anxiety disorder [F41.1] 02/08/2014  . Chronic cholecystitis with calculus [K80.10] 01/23/2014  . Major depressive disorder, recurrent episode ([facility]) [F33.9] 10/27/2011   Total Time spent with patient: 30 minutes  Past Psychiatric History: see HPI  Past Medical History:  Past Medical History:  Diagnosis Date  . Anxiety   . Depression   . Multiple body piercings    nose, ears  . PTSD (post-traumatic stress disorder)   . Vision abnormalities    corrective lenses    Past Surgical History:  Procedure Laterality Date  . LAPAROSCOPIC CHOLECYSTECTOMY SINGLE PORT N/A 03/04/2014   Procedure: LAPAROSCOPIC CHOLECYSTECTOMY SINGLE PORT;  Surgeon: Ardeth Sportsman, MD;   Location: [facility];  Service: General;  Laterality: N/A;   Family History:  Family History  Problem Relation Age of Onset  . Depression Mother   . Varicose Veins Mother   . Asthma Brother   . Alcohol abuse Father   . Anxiety disorder Father   . COPD Paternal Grandmother    Family Psychiatric  History: see HPI Social History:  History  Alcohol Use No     History  Drug Use No    Social History   Social History  . Marital status: Single    Spouse name: N/A  . Number of children: N/A  . Years of education: N/A   Social History Main Topics  . Smoking status: Never Smoker  . Smokeless tobacco: Never Used  . Alcohol use No  . Drug use: No  . Sexual activity: No   Other Topics Concern  . None   Social History Narrative  . None   Additional Social History:                         Sleep: Fair  Appetite:  Fair  Current Medications: Current Facility-Administered Medications  Medication Dose Route Frequency Provider Last Rate Last Dose  . alum & mag hydroxide-simeth (MAALOX/MYLANTA) 200-200-20 MG/5ML suspension 30 mL  30 mL Oral Q4H PRN Charm Rings, NP      . ARIPiprazole (ABILIFY) tablet 5 mg  5 mg Oral QHS Charm Rings, NP   5 mg at 08/03/16 2117  . buPROPion (WELLBUTRIN XL) 24 hr tablet 150 mg  150 mg Oral Daily Craige Cotta, MD   150 mg  at 08/04/16 0825  . hydrOXYzine (ATARAX/VISTARIL) tablet 10 mg  10 mg Oral TID PRN Charm RingsJamison Y Lord, NP      . magnesium hydroxide (MILK OF MAGNESIA) suspension 30 mL  30 mL Oral Daily PRN Charm RingsJamison Y Lord, NP      . traZODone (DESYREL) tablet 50 mg  50 mg Oral QHS PRN Charm RingsJamison Y Lord, NP   50 mg at 08/03/16 2116  . venlafaxine XR (EFFEXOR-XR) 24 hr capsule 37.5 mg  37.5 mg Oral Q breakfast Craige CottaFernando A Cobos, MD   37.5 mg at 08/04/16 0825    Lab Results:  Results for orders placed or performed during the hospital encounter of 08/02/16 (from the past 48 hour(s))  Vitamin B12     Status: None   Collection Time: 08/04/16   8:13 AM  Result Value Ref Range   Vitamin B-12 291 180 - 914 pg/mL    Comment: (NOTE) This assay is not validated for testing neonatal or myeloproliferative syndrome specimens for Vitamin B12 levels. [facility]   Folate     Status: None   Collection Time: 08/04/16  8:13 AM  Result Value Ref Range   Folate 28.8 >5.9 ng/mL    Comment: [facility]formed [facility]    Blood Alcohol level:  Lab Results  Component Value Date   [facility] 08/01/2016   ETH <5 06/01/2016    Metabolic Disorder Labs: Lab Results  Component Value Date   HGBA1C 4.9 06/03/2016   MPG 94 06/03/2016   No results found for: PROLACTIN Lab Results  Component Value Date   CHOL 161 06/03/2016   TRIG 96 06/03/2016   HDL 44 06/03/2016   CHOLHDL 3.7 06/03/2016   VLDL 19 06/03/2016   LDLCALC 98 06/03/2016    Physical Findings: AIMS:  , ,  ,  ,    CIWA:    COWS:     Musculoskeletal: Strength & Muscle Tone: within normal limits Gait & Station: normal Patient leans: N/A  Psychiatric Specialty Exam:   Physical Exam  Nursing note and vitals reviewed.   ROS  Blood pressure 94/60, pulse 68, temperature 97.5 F (36.4 C), temperature source Oral, resp. rate 18, height 5\' 1"  (1.549 m), weight 63 kg (139 lb), last menstrual period 05/12/2016, SpO2 100 %.Body mass index is 26.26 kg/m.  General Appearance: Casual  Eye Contact:  Good  Speech:  Clear and Coherent  Volume:  Normal  Mood:  Anxious  Affect:  Appropriate  Thought Process:  Coherent  Orientation:  Full (Time, Place, and Person)  Thought Content:  Rumination  Suicidal Thoughts:  No  Homicidal Thoughts:  No  Memory:  Immediate;   Fair Recent;   Fair Remote;   Fair  Judgement:  Fair  Insight:  Lacking  Psychomotor Activity:  Normal  Concentration:  Concentration: Fair and Attention Span: Fair  Recall:  FiservFair  Fund of Knowledge:  Fair  Language:  Fair  Akathisia:  Negative  Handed:  Right  AIMS (if indicated):      Assets:  Financial Resources/Insurance Resilience Social Support  ADL's:  Intact  Cognition:  WNL  Sleep:  Number of Hours: 6.75   Treatment Plan Summary: Review of chart, vital signs, medications, and notes.  1-Individual and group therapy  2-Medication management for depression and anxiety: Medications reviewed with the patient.  Abilify 5 mg bedtime mood stabilization, Wellbutrin XL 150 mg depression, Effexor XR 37.5 mg Depression  3-Coping skills for depression, anxiety  4-Continue crisis stabilization and management  5-Address health issues--monitoring vital signs, stable  6-Treatment plan in progress to prevent relapse of depression and anxiety  Lindwood Qua, NP [facility] 08/04/2016, 1:28 PM

## 2016-08-04 NOTE — Progress Notes (Signed)
D: Pt presents with depressed affect and mood.  She reports her goal is to "stay positive" and she met her goal.  Pt denies SI/HI, denies hallucinations, denies pain. She reports she slept well last night with PRN Trazodone.  Pt attended evening group.   A: Met with pt and offered support and encouragement.  Medication administered per order.  PRN medication administered for sleep.  R: Pt is compliant with medications.  She verbally contracts for safety.  Will continue to monitor and assess.

## 2016-08-04 NOTE — Plan of Care (Signed)
Problem: Activity: Goal: Sleeping patterns will improve Outcome: Progressing Pt reports she slept well last night with PRN Trazodone.  She slept 6.75 hours according to flowsheet.

## 2016-08-05 LAB — VITAMIN D 25 HYDROXY (VIT D DEFICIENCY, FRACTURES): Vit D, 25-Hydroxy: 17.4 ng/mL — ABNORMAL LOW (ref 30.0–100.0)

## 2016-08-05 MED ORDER — VITAMIN D3 25 MCG (1000 UNIT) PO TABS
1000.0000 [IU] | ORAL_TABLET | Freq: Every day | ORAL | Status: DC
  Administered 2016-08-05 – 2016-08-06 (×2): 1000 [IU] via ORAL
  Filled 2016-08-05 (×5): qty 1

## 2016-08-05 NOTE — Progress Notes (Addendum)
[facility] MD Progress Note  08/05/2016 4:44 PM Jo Haas  MRN:  [id number] Subjective:  Patient reports partial improvement since admission but continues to report depression, and continues to report low energy as a significant symptom. Denies medication side effects. Denies current suicidal ideations or self injurious ideations. Objective:   I have discussed case with treatment team and have met with patient . Patient presents partially improved compared to admission but still with a constricted, sad affect, although does smile appropriately at times. Denies any SI or self injurious ideations at this time and contracts for safety on unit. Tolerating medications well. Visible on unit, going to some groups, no disruptive or agitated behaviors on unit . Labs reviewed - of note Vitamin  D serum level is low ( 17.4) ( B12 and Folate levels are within normal)  Principal Problem: MDD (major depressive disorder), recurrent severe, without psychosis ([place]) Diagnosis:   Patient Active Problem List   Diagnosis Date Noted  . MDD (major depressive disorder), recurrent severe, without psychosis ([place]) [F33.2] 08/02/2016  . MDD (major depressive disorder), recurrent episode, severe ([place]) [F33.2] 06/01/2016  . GAD (generalized anxiety disorder) [F41.1] 04/10/2015  . Severe episode of recurrent major depressive disorder, without psychotic features ([place]) [F33.2] 04/10/2015  . PTSD (post-traumatic stress disorder) [F43.10] 04/10/2015  . Social anxiety disorder [F40.10] 04/10/2015  . OCD (obsessive compulsive disorder) [F42.9] 04/10/2015  . Routine general medical examination at a health care facility [Z00.00] 04/10/2015  . Generalized anxiety disorder [F41.1] 02/08/2014  . Chronic cholecystitis with calculus [K80.10] 01/23/2014  . Major depressive disorder, recurrent episode ([place]) [F33.9] 10/27/2011   Total Time spent with patient: 20 minutes   Past Psychiatric History: see HPI  Past Medical History:   Past Medical History:  Diagnosis Date  . Anxiety   . Depression   . Multiple body piercings    nose, ears  . PTSD (post-traumatic stress disorder)   . Vision abnormalities    corrective lenses    Past Surgical History:  Procedure Laterality Date  . LAPAROSCOPIC CHOLECYSTECTOMY SINGLE PORT N/A 03/04/2014   Procedure: LAPAROSCOPIC CHOLECYSTECTOMY SINGLE PORT;  Surgeon: Adin Hector, MD;  Location: [facility];  Service: General;  Laterality: N/A;   Family History:  Family History  Problem Relation Age of Onset  . Depression Mother   . Varicose Veins Mother   . Asthma Brother   . Alcohol abuse Father   . Anxiety disorder Father   . COPD Paternal Grandmother    Family Psychiatric  History: see HPI Social History:  History  Alcohol Use No     History  Drug Use No    Social History   Social History  . Marital status: Single    Spouse name: N/A  . Number of children: N/A  . Years of education: N/A   Social History Main Topics  . Smoking status: Never Smoker  . Smokeless tobacco: Never Used  . Alcohol use No  . Drug use: No  . Sexual activity: No   Other Topics Concern  . None   Social History Narrative  . None   Additional Social History:   Sleep: improving   Appetite:  Fair  Current Medications: Current Facility-Administered Medications  Medication Dose Route Frequency Provider Last Rate Last Dose  . alum & mag hydroxide-simeth (MAALOX/MYLANTA) 200-200-20 MG/5ML suspension 30 mL  30 mL Oral Q4H PRN Patrecia Pour, NP      . ARIPiprazole (ABILIFY) tablet 5 mg  5 mg Oral QHS Asa Saunas  Lord, NP   5 mg at 08/04/16 2112  . buPROPion (WELLBUTRIN XL) 24 hr tablet 150 mg  150 mg Oral Daily Jenne Campus, MD   150 mg at 08/05/16 0831  . hydrOXYzine (ATARAX/VISTARIL) tablet 10 mg  10 mg Oral TID PRN Patrecia Pour, NP      . magnesium hydroxide (MILK OF MAGNESIA) suspension 30 mL  30 mL Oral Daily PRN Patrecia Pour, NP      . traZODone (DESYREL) tablet 50 mg  50  mg Oral QHS PRN Patrecia Pour, NP   50 mg at 08/04/16 2112  . venlafaxine XR (EFFEXOR-XR) 24 hr capsule 37.5 mg  37.5 mg Oral Q breakfast Jenne Campus, MD   37.5 mg at 08/05/16 0831    Lab Results:  Results for orders placed or performed during the hospital encounter of 08/02/16 (from the past 48 hour(s))  Vitamin B12     Status: None   Collection Time: 08/04/16  8:13 AM  Result Value Ref Range   Vitamin B-12 291 180 - 914 pg/mL    Comment: (NOTE) This assay is not validated for testing neonatal or myeloproliferative syndrome specimens for Vitamin B12 levels. Performed at [facility] D 25 Hydroxy (Vit-D Deficiency, Fractures)     Status: Abnormal   Collection Time: 08/04/16  8:13 AM  Result Value Ref Range   Vit D, 25-Hydroxy 17.4 (L) 30.0 - 100.0 ng/mL    Comment: (NOTE) Vitamin D deficiency has been defined by the [place] practice guideline as a level of serum 25-OH vitamin D less than 20 ng/mL (1,2). The Endocrine Society went on to further define vitamin D insufficiency as a level between 21 and 29 ng/mL (2). 1. IOM (Institute of Medicine). 2010. Dietary reference   intakes for calcium and D. [place]: The   [employer]. 2. Holick MF, Binkley Ayden, Bischoff-Ferrari HA, et al.   Evaluation, treatment, and prevention of vitamin D   deficiency: an Endocrine Society clinical practice   guideline. JCEM. 2011 Jul; 96(7):1911-30. Performed At: [facility] [place], [place] [id number] Lindon Romp MD NO:[id number] Performed at [facility]   Folate     Status: None   Collection Time: 08/04/16  8:13 AM  Result Value Ref Range   Folate 28.8 >5.9 ng/mL    Comment: Performed at [facility]    Blood Alcohol level:  Lab Results  Component Value Date   [facility] <5 08/01/2016   ETH <5 96/28/3662    Metabolic Disorder Labs: Lab Results  Component Value  Date   HGBA1C 4.9 06/03/2016   MPG 94 06/03/2016   No results found for: PROLACTIN Lab Results  Component Value Date   CHOL 161 06/03/2016   TRIG 96 06/03/2016   HDL 44 06/03/2016   CHOLHDL 3.7 06/03/2016   VLDL 19 06/03/2016   LDLCALC 98 06/03/2016    Physical Findings: AIMS:  , ,  ,  ,    CIWA:    COWS:     Musculoskeletal: Strength & Muscle Tone: within normal limits Gait & Station: normal Patient leans: N/A  Psychiatric Specialty Exam:   Physical Exam  Nursing note and vitals reviewed.   ROS no chest pain, no shortness of breath, no vomiting   Blood pressure (!) 104/52, pulse (!) 102, temperature 97.9 F (36.6 C), temperature source Oral, resp. rate 16, height 5' 1"  (1.549  m), weight 63 kg (139 lb), last menstrual period 05/12/2016, SpO2 100 %.Body mass index is 26.26 kg/m.  General Appearance:improved grooming   Eye Contact:  Good  Speech:  Normal Rate  Volume:  Normal  Mood: depressed, but improved compared to admission  Affect:  Constricted, sad, smiles at times appropriately   Thought Process:  Linear  Orientation:  Full (Time, Place, and Person)  Thought Content:  Denies hallucinations, no delusions   Suicidal Thoughts:  No denies any active suicidal ideations or self injurious ideations, contracts for safety on unit   Homicidal Thoughts:  No denies   Memory:  Recent and remote grossly intact  Judgement:  Improving   Insight:  Improving   Psychomotor Activity:  Normal  Concentration:  Concentration: Good and Attention Span: Good  Recall:  Good  Fund of Knowledge:  Good  Language:  Good  Akathisia:  Negative  Handed:  Right  AIMS (if indicated):     Assets:  Financial Resources/Insurance Resilience Social Support  ADL's:  Intact  Cognition:  WNL  Sleep:  Number of Hours: 6.75    Assessment - partially improved compared to admission, remains depressed, but affect is somewhat more reactive, no current SI. She is tolerating Wellbutrin XL trial well  thus far and has not presented with symptoms of Venlafaxine WDL ( at this time plan is to wean off Effexor XR and continue /titrate Wellbutrin trial as tolerated ) . Patient has reported a subjective sense of fatigue and low energy as part of her depression symptoms- vitamin D deficiency may be a contributory factor.    Treatment Plan Summary: Continue to encourage group and milieu participation to work on coping skills and symptom reduction Continue  Abilify 5 mg QDAY for mood disorder, antidepressant augmentation  Continue Wellbutrin XL 150 mg for depression Continue Effexor XR 37.5 mg for depression ( tapering down gradually to minimize risk of venlafaxine WDL)  Start Vitamin D supplementation. Treatment team working on disposition [address], [place], MD 08/05/2016, 4:44 PM   Patient ID: Jo Haas, female   DOB: Nov 23, 1995, 21 y.o.   MRN: [id number]

## 2016-08-05 NOTE — Plan of Care (Signed)
Problem: Medication: Goal: Compliance with prescribed medication regimen will improve Outcome: Progressing Pt. verbalize understanding to changes in meds.and is taking them as prescribed.

## 2016-08-05 NOTE — Progress Notes (Signed)
D: Pt presents with sad affect and depressed mood. Pt appears withdrawn and noted to have little engagement today. Pt rates depression 4/10. Anxiety 3/10. Pt reports a lack of energy and requested to have Wellbutrin increased. Dr. Jama Flavorsobos made aware.  Pt denies suicidal thoughts and verbally contracts for safety.  A: Medications administered as ordered per MD. Medications administered as ordered per MD. Verbal support provided. Pt encouraged to attend groups. 15 minute checks performed for safety.  R: Pt receptive to tx. Pt goal is to speak with the CSW today.

## 2016-08-05 NOTE — Progress Notes (Signed)
  DATA ACTION RESPONSE  Objective- Pt. is up and visible in the milieu, sitting in the dayroom quielty/talking on the phone; non-engaging to peers.Pt. presents with a depressed affect and mood. Pt's behavior brighten on approach after talking with Clinical research associatewriter. Subjective- Denies having any SI/HI/AVH/Pain at this time. Pt. states " I just feel no energy".Pt. continues to be cooperative and remain safe on the unit.  1:1 interaction in private to establish rapport. Encouragement, education, & support given from staff. Meds. ordered and administered. PRN Trazodone requested and will re-eval accordingly.   Safety maintained with Q 15 checks. Continues to follow treatment plan and will monitor closely. No additonal questions/concerns noted.

## 2016-08-05 NOTE — Plan of Care (Signed)
Problem: Health Behavior/Discharge Planning: Goal: Compliance with therapeutic regimen will improve Outcome: Progressing Pt compliant with taking meds and attending groups as scheduled.

## 2016-08-05 NOTE — [facility] Group Notes (Signed)
[facility] 08/05/2016   1:47 PM  Type of Therapy: Mental Health Association Presentation  Participation Level: Did Not Attend    Jo EmoryHannah Alexanderjames Berg LCSW, MSW Clinical Social Work: Optician, [employer]ystem [employer]

## 2016-08-05 NOTE — Progress Notes (Signed)
Adult Psychoeducational Group Note  Date:  08/05/2016 Time:  2:43 AM  Group Topic/Focus:  Wrap-Up Group:   The focus of this group is to help patients review their daily goal of treatment and discuss progress on daily workbooks.   Participation Level:  Active  Participation Quality:  Appropriate  Affect:  Appropriate  Cognitive:  Alert  Insight: Appropriate  Engagement in Group:  Engaged  Modes of Intervention:  Discussion  Additional Comments:  Patient states, "I had a good day". On a scale between 1-10, (1=worse, 10=best) patient rates her day an 8. Patient's goal for today was to think and stay positive.  Saul Dorsi L Derell Bruun 08/05/2016, 2:43 AM

## 2016-08-05 NOTE — [facility] Group Notes (Signed)
[facility] LCSW Group Therapy 08/05/2016 1:15 PM  Type of Therapy: Group Therapy- Emotion Regulation  Participation Level: Active   Participation Quality:  Appropriate  Affect: Appropriate  Cognitive: Alert and Oriented   Insight:  Developing/Improving  Engagement in Therapy: Developing/Improving and Engaged   Modes of Intervention: Clarification, Confrontation, Discussion, Education, Exploration, Limit-setting, Orientation, Problem-solving, Rapport Building, Dance movement psychotherapisteality Testing, Socialization and Support  Summary of Progress/Problems: The topic for group today was emotional regulation. This group focused on both positive and negative emotion identification and allowed group members to process ways to identify feelings, regulate negative emotions, and find healthy ways to manage internal/external emotions. Group members were asked to reflect on a time when their reaction to an emotion led to a negative outcome and explored how alternative responses using emotion regulation would have benefited them. Group members were also asked to discuss a time when emotion regulation was utilized when a negative emotion was experienced.   Vernie ShanksLauren Woodruff Skirvin, LCSW 08/04/16 3:05PM

## 2016-08-06 MED ORDER — VENLAFAXINE HCL ER 37.5 MG PO CP24: 37.5000 mg | ORAL_CAPSULE | Freq: Every day | ORAL | 0 refills | Status: DC

## 2016-08-06 MED ORDER — BUPROPION HCL ER (XL) 150 MG PO TB24: 150.0000 mg | ORAL_TABLET | Freq: Every day | ORAL | 0 refills | Status: DC

## 2016-08-06 MED ORDER — VITAMIN D3 25 MCG (1000 UNIT) PO TABS: 1000.0000 [IU] | ORAL_TABLET | Freq: Every day | ORAL | 0 refills | Status: DC

## 2016-08-06 MED ORDER — TRAZODONE HCL 50 MG PO TABS: 50.0000 mg | ORAL_TABLET | Freq: Every evening | ORAL | 0 refills | Status: DC | PRN

## 2016-08-06 MED ORDER — HYDROXYZINE HCL 10 MG PO TABS
10.0000 mg | ORAL_TABLET | Freq: Three times a day (TID) | ORAL | 0 refills | Status: DC | PRN
Start: 2016-08-06 — End: 2016-08-30

## 2016-08-06 MED ORDER — ARIPIPRAZOLE 5 MG PO TABS: 5.0000 mg | ORAL_TABLET | Freq: Every day | ORAL | 0 refills | Status: DC

## 2016-08-06 NOTE — [facility] Suicide Risk Assessment (Addendum)
[facility] Discharge Suicide Risk Assessment   Principal Problem: MDD (major depressive disorder), recurrent severe, without psychosis (HCC) Discharge Diagnoses:  Patient Active Problem List   Diagnosis Date Noted  . MDD (major depressive disorder), recurrent severe, without psychosis (HCC) [F33.2] 08/02/2016  . MDD (major depressive disorder), recurrent episode, severe (HCC) [F33.2] 06/01/2016  . GAD (generalized anxiety disorder) [F41.1] 04/10/2015  . Severe episode of recurrent major depressive disorder, without psychotic features (HCC) [F33.2] 04/10/2015  . PTSD (post-traumatic stress disorder) [F43.10] 04/10/2015  . Social anxiety disorder [F40.10] 04/10/2015  . OCD (obsessive compulsive disorder) [F42.9] 04/10/2015  . Routine general medical examination at a health care facility [Z00.00] 04/10/2015  . Generalized anxiety disorder [F41.1] 02/08/2014  . Chronic cholecystitis with calculus [K80.10] 01/23/2014  . Major depressive disorder, recurrent episode (HCC) [F33.9] 10/27/2011    Total Time spent with patient: 30 minutes  Musculoskeletal: Strength & Muscle Tone: within normal limits Gait & Station: normal Patient leans: N/A  Psychiatric Specialty Exam: ROS no vomiting, no rash, no fever, no chills  Blood pressure (!) 93/50, pulse 91, temperature 97.9 F (36.6 C), temperature source Oral, resp. rate 17, height 5\' 1"  (1.549 m), weight 63 kg (139 lb), last menstrual period 05/12/2016, SpO2 100 %.Body mass index is 26.26 kg/m.  General Appearance: improved   Eye Contact::  Good  Speech:  Normal Rate409  Volume:  Normal  Mood:  reports she is feeling better, less depressed   Affect:  more reactive, smiles at times appropriately   Thought Process:  Linear  Orientation:  Full (Time, Place, and Person)  Thought Content:  denies hallucinations, no delusions , not internally preoccupied   Suicidal Thoughts:  No denies any suicidal or self injurious ideations   Homicidal Thoughts:  No   Memory:  recent and remote grossly intact   Judgement:  Other:  improving   Insight:  improving   Psychomotor Activity:  Normal  Concentration:  Good  Recall:  Good  Fund of Knowledge:Good  Language: Good  Akathisia:  Negative  Handed:  Right  AIMS (if indicated):   no abnormal or involuntary movements noted or reported  Assets:  Desire for Improvement Resilience  Sleep:  Number of Hours: 6.75  Cognition: WNL  ADL's:  Intact   Mental Status Per Nursing Assessment::   On Admission:     Demographic Factors:  21 year old single female , lives with mother, in college   Loss Factors: Argument with mother was acute triggering event  Historical Factors: History of depression, history of prior psychiatric admission and participation in PHP . History of self injurious behaviors, self cutting in the past   Risk Reduction Factors:   Sense of responsibility to family, Living with another person, especially a relative, Positive social support and Positive coping skills or problem solving skills  Continued Clinical Symptoms:  At this time patient is improved compared to admission presentation . She is alert,attentive, well related, describes improving mood and presents with reactive , fuller range of affect , no thought disorder, no suicidal or homicidal ideations, future oriented, no hallucinations, no delusions.  Behavior on unit has been calm, pleasant. Denies medication side effects. Side effect profiles reviewed  * Of note, patient states she has had good visit, conversation with her mother since her admission and that they have worked out issues, states she realizes mother is loving and supportive . Cognitive Features That Contribute To Risk:  No gross cognitive deficits noted upon discharge. Is alert , attentive, and  oriented x 3   Suicide Risk:  Mild:  Suicidal ideation of limited frequency, intensity, duration, and specificity.  There are no identifiable plans, no associated  intent, mild dysphoria and related symptoms, good self-control (both objective and subjective assessment), few other risk factors, and identifiable protective factors, including available and accessible social support.  Follow-up Information    [facility] Follow up.   Specialty:  Behavioral Health Why:  [facility]. 1/25 @ 10:30am with Dr. Selinda MichaelsArgawahl.  Contact information: [address] [place] [id number] [phone]          Plan Of Care/Follow-up recommendations:  Activity:  as tolerated Diet:  reagular Tests:  NA Other:  see below  Patient is leaving unit in good spirits Plans to return home States that mother will pick her up later today Follow up as above - [facility] but reported she preferred individual psychotherapy at this time.  Nehemiah MassedOBOS, Mariha Sleeper, MD 08/06/2016, 2:59 PM

## 2016-08-06 NOTE — Progress Notes (Signed)
Recreation Therapy Notes  Date: 08/06/16 Time: 0930 Location: [address] Dayroom  Group Topic: Stress Management  Goal Area(s) Addresses:  Patient will verbalize importance of using healthy stress management.  Patient will identify positive emotions associated with healthy stress management.   Intervention: Stress Management  Activity :  Choice Meditation.  LRT introduced the stress management technique of meditation.  LRT played a meditation focused on choices to allow patients the opportunity to engage in the activity.  Patients were to follow along with the meditation as it played.  Education:  Stress Management, Discharge Planning.   Education Outcome: Acknowledges edcuation/In group clarification offered/Needs additional education  Clinical Observations/Feedback: Pt did not attend group.    Caroll RancherMarjette Samuele Storey, LRT/CTRS         Caroll RancherLindsay, Dymir Neeson A 08/06/2016 12:38 PM

## 2016-08-06 NOTE — Progress Notes (Signed)
Pt is prepared for dc as dc instructions are reviewed with her. ( AVS, transition record, MD SRA asnd suicide safety plan)She demonstrates verbal understanding and says she will comply. She writes on her daily assessment she denies SI today and she rates her depression, hopelessness anda anxiety " 4/3/3", respectively. After belongings in locker are returned to her she is escorted to bldg entrance and dc'd .

## 2016-08-06 NOTE — Discharge Summary (Signed)
Physician Discharge Summary Note  Patient:  Jo Haas is an 21 y.o., female MRN:  [id number] DOB:  1996-07-12 Patient phone:  [phone] (home)  Patient address:   [address] [place] [address],  Total Time spent with patient: Greater than 30 minutes  Date of Admission:  08/02/2016  Date of Discharge: 08-06-16  Reason for Admission: Suicide attempt by ingestion of toxic fluid.  Principal Problem: MDD (major depressive disorder), recurrent severe, without psychosis [facility])  Discharge Diagnoses: Patient Active Problem List   Diagnosis Date Noted  . MDD (major depressive disorder), recurrent severe, without psychosis (HCC) [F33.2] 08/02/2016  . MDD (major depressive disorder), recurrent episode, severe (HCC) [F33.2] 06/01/2016  . GAD (generalized anxiety disorder) [F41.1] 04/10/2015  . Severe episode of recurrent major depressive disorder, without psychotic features (HCC) [F33.2] 04/10/2015  . PTSD (post-traumatic stress disorder) [F43.10] 04/10/2015  . Social anxiety disorder [F40.10] 04/10/2015  . OCD (obsessive compulsive disorder) [F42.9] 04/10/2015  . Routine general medical examination at a health care facility [Z00.00] 04/10/2015  . Generalized anxiety disorder [F41.1] 02/08/2014  . Chronic cholecystitis with calculus [K80.10] 01/23/2014  . Major depressive disorder, recurrent episode (HCC) [F33.9] 10/27/2011   Past Psychiatric History: Major depressive disorder, recurrent.  Past Medical History:  Past Medical History:  Diagnosis Date  . Anxiety   . Depression   . Multiple body piercings    nose, ears  . PTSD (post-traumatic stress disorder)   . Vision abnormalities    corrective lenses    Past Surgical History:  Procedure Laterality Date  . LAPAROSCOPIC CHOLECYSTECTOMY SINGLE PORT N/A 03/04/2014   Procedure: LAPAROSCOPIC CHOLECYSTECTOMY SINGLE PORT;  Surgeon: Ardeth Sportsman, MD;  Location: [facility];  Service: General;  Laterality: N/A;   Family  History:  Family History  Problem Relation Age of Onset  . Depression Mother   . Varicose Veins Mother   . Asthma Brother   . Alcohol abuse Father   . Anxiety disorder Father   . COPD Paternal Grandmother    Family Psychiatric  History: See H&P  Social History:  History  Alcohol Use No     History  Drug Use No    Social History   Social History  . Marital status: Single    Spouse name: N/A  . Number of children: N/A  . Years of education: N/A   Social History Main Topics  . Smoking status: Never Smoker  . Smokeless tobacco: Never Used  . Alcohol use No  . Drug use: No  . Sexual activity: No   Other Topics Concern  . None   Social History Narrative  . None   Hospital Course: 21 year old single female, known to our unit from prior psychiatric admissions . States she has been feeling depressed, anxious. She impulsively ingested a mouthful of a household cleaning solution, called " the Works". She states it was impulsive in the context of argument with her mother. States action was not a suicide attempt, and states " I really don't even know why I did it, it happened really fast". After ingesting substance she vomited, family contacted 911.  Reports neuro-vegetative symptoms of depression, and in particular stresses a sense of low energy, which in turn " makes me feel worthless, like I am not doing what I should be doing" Patient states she had recently run out of her medications for several days, but had restarted them recently .  Jo Haas was admitted to the hospital for worsening symptoms of depression &  crisis management due to suicide attempt by ingesting some cleaning liquid. She cited an argument with her mother as the trigger. She was in need of mood stabilization. After her admission assessment, Jo Haas's presenting symptoms were identified. The medication regimen targeting those symptoms were initiated. She was medicated & discharged on; Wellbutrin XL 150 mg, Effexor  XR 37.5 mg for depression & Abilify 5 mg for mood control. Her other pre-existing medical problems were identified & her pertinent home medications for the those medical issues re-started. Jo Haas was enrolled & participated in the group counseling sessions being offered & held on this unit. She learned coping skills..  During the course of her hospitalization, Jo Haas's improvement was monitored by observation & her daily report of symptom reduction noted.  Her emotional and mental status were monitored by daily self-inventory reports [facility] & the clinical staff. She was evaluated by the treatment team for mood stability and plans for continued recovery after discharge. Jo Haas's motivation was an integral factor in her mood stability. She was offered further treatment options upon discharge on outpatient basis as noted below.   Upon discharge, Jo Haas was both mentally and medically stable. She denies suicidal/homicidal ideations, auditory/visual/tactile hallucinations, delusional thoughts or paranoia. [facility] in no apparent distress. Transportation per family.  Physical Findings: AIMS:  , ,  ,  ,    CIWA:    COWS:     Musculoskeletal: Strength & Muscle Tone: within normal limits Gait & Station: normal Patient leans: N/A  Psychiatric Specialty Exam: Physical Exam  Constitutional: She appears well-developed.  HENT:  Head: Normocephalic.  Eyes: Pupils are equal, round, and reactive to light.  Neck: Normal range of motion.  Cardiovascular: Normal rate.   Respiratory: Effort normal.  GI: Soft.  Genitourinary:  Genitourinary Comments: Denies any issues in this area  Musculoskeletal: Normal range of motion.  Neurological: She is alert.  Skin: Skin is warm.    Review of Systems  Constitutional: Negative.   HENT: Negative.   Eyes: Negative.   Respiratory: Negative.   Cardiovascular: Negative.   Gastrointestinal: Negative.    Genitourinary: Negative.   Musculoskeletal: Negative.   Skin: Negative.   Neurological: Negative.   Endo/Heme/Allergies: Negative.   Psychiatric/Behavioral: Positive for depression (Stable). Negative for suicidal ideas.    Blood pressure (!) 93/50, pulse 91, temperature 97.9 F (36.6 C), temperature source Oral, resp. rate 17, height 5\' 1"  (1.549 m), weight 63 kg (139 lb), last menstrual period 05/12/2016, SpO2 100 %.Body mass index is 26.26 kg/m.  See MD's SRA   Have you used any form of tobacco in the last 30 days? (Cigarettes, Smokeless Tobacco, Cigars, and/or Pipes): No  Has this patient used any form of tobacco in the last 30 days? (Cigarettes, Smokeless Tobacco, Cigars, and/or Pipes): No  Blood Alcohol level:  Lab Results  Component Value Date   [facility]5 08/01/2016   ETH <5 06/01/2016   Metabolic Disorder Labs:  Lab Results  Component Value Date   HGBA1C 4.9 06/03/2016   MPG 94 06/03/2016   No results found for: PROLACTIN Lab Results  Component Value Date   CHOL 161 06/03/2016   TRIG 96 06/03/2016   HDL 44 06/03/2016   CHOLHDL 3.7 06/03/2016   VLDL 19 06/03/2016   LDLCALC 98 06/03/2016   See Psychiatric Specialty Exam and Suicide Risk Assessment completed by Attending Physician prior to discharge.  Discharge destination:  Home  Is patient on multiple antipsychotic therapies at discharge:  No   Has Patient had three or more failed trials of antipsychotic monotherapy by history:  No  Recommended Plan for Multiple Antipsychotic Therapies: NA  Allergies as of 08/06/2016      Reactions   Amoxicillin Swelling   .Marland KitchenHas patient had a PCN reaction causing immediate rash, facial/tongue/throat swelling, SOB or lightheadedness with hypotension: UNKNOWN Has patient had a PCN reaction causing severe rash involving mucus membranes or skin necrosis: UNKNOWN Has patient had a PCN reaction that required hospitalization UNKNOWN Has patient had a PCN reaction occurring within the  last 10 years: Yes - infant allergy If all of the above answers are "NO", then may proceed with Cephalosporin use.   Pollen Extract Swelling   Food containing pollen      Medication List    TAKE these medications     Indication  ARIPiprazole 5 MG tablet Commonly known as:  ABILIFY Take 1 tablet (5 mg total) by mouth at bedtime. For mood control What changed:  additional instructions  Indication:  Mood control   buPROPion 150 MG 24 hr tablet Commonly known as:  WELLBUTRIN XL Take 1 tablet (150 mg total) by mouth daily. For depression Start taking on:  08/07/2016  Indication:  Major Depressive Disorder   cholecalciferol 1000 units tablet Commonly known as:  VITAMIN D Take 1 tablet (1,000 Units total) by mouth daily. For bone health Start taking on:  08/07/2016  Indication:  Bone health   hydrOXYzine 10 MG tablet Commonly known as:  ATARAX/VISTARIL Take 1 tablet (10 mg total) by mouth 3 (three) times daily as needed for anxiety. What changed:  See the new instructions.  Indication:  Anxiety Neurosis   traZODone 50 MG tablet Commonly known as:  DESYREL Take 1 tablet (50 mg total) by mouth at bedtime as needed for sleep. What changed:  See the new instructions.  Indication:  Trouble Sleeping   venlafaxine XR 37.5 MG 24 hr capsule Commonly known as:  EFFEXOR-XR Take 1 capsule (37.5 mg total) by mouth daily with breakfast. For depression Start taking on:  08/07/2016 What changed:  medication strength  how much to take  additional instructions  Indication:  Major Depressive Disorder      Follow-up Information    [facility] Follow up.   Specialty:  Behavioral Health Why:  1/18 at 1:00pm with [facility] for therapy. 1/25 @ 10:30am with Dr. Selinda Michaels.  Contact information: [address] [place] [address] [phone]         Follow-up recommendations: Activity:  As tolerated Diet: As recommended by your  primary care doctor. Keep all scheduled follow-up appointments as recommended.  Comments: Patient is instructed prior to discharge to: Take all medications as prescribed by his/her mental healthcare provider. Report any adverse effects and or reactions from the medicines to his/her outpatient provider promptly. Patient has been instructed & cautioned: To not engage in alcohol and or illegal drug use while on prescription medicines. In the event of worsening symptoms, patient is instructed to call the crisis hotline, 911 and or go to the nearest ED for appropriate evaluation and treatment of symptoms. To follow-up with his/her primary care provider for your other medical issues, concerns and or health care needs.   Signed: Sanjuana Kava, NP, PMHNP, FNP-BC 08/06/2016, 10:58 AM   Patient seen, Suicide Assessment Completed.  Disposition Plan Reviewed

## 2016-08-06 NOTE — [facility] Suicide Risk Assessment (Signed)
[facility] INPATIENT:  Family/Significant Other Suicide Prevention Education  Suicide Prevention Education:  Education Completed; Jo ArmourMelissa Haas, Pt's mother [phone], has been identified by the patient as the family member/significant other with whom the patient will be residing, and identified as the person(s) who will aid the patient in the event of a mental health crisis (suicidal ideations/suicide attempt).  With written consent from the patient, the family member/significant other has been provided the following suicide prevention education, prior to the and/or following the discharge of the patient.  The suicide prevention education provided includes the following:  Suicide risk factors  Suicide prevention and interventions  National Suicide Hotline telephone number  [facility] assessment telephone number  [facility] Emergency Assistance 911  [facility] and/or [facility]idential [facility] Crisis [facility] telephone number  Request made of family/significant other to:  Remove weapons (e.g., guns, rifles, knives), all items previously/currently identified as safety concern.    Remove drugs/medications (over-the-counter, prescriptions, illicit drugs), all items previously/currently identified as a safety concern.  The family member/significant other verbalizes understanding of the suicide prevention education information provided.  The family member/significant other agrees to remove the items of safety concern listed above.  Jo LennertLauren C Prakriti Haas 08/06/2016, 11:30 AM

## 2016-08-06 NOTE — Tx Team (Signed)
Interdisciplinary Treatment and Diagnostic Plan Update  08/06/2016 Time of Session: 11:23 AM  Jo Haas MRN: [id number]  Principal Diagnosis: MDD (major depressive disorder), recurrent severe, without psychosis (HCC)  Secondary Diagnoses: Principal Problem:   MDD (major depressive disorder), recurrent severe, without psychosis (HCC)   Current Medications:  Current Facility-Administered Medications  Medication Dose Route Frequency Provider Last Rate Last Dose  . alum & mag hydroxide-simeth (MAALOX/MYLANTA) 200-200-20 MG/5ML suspension 30 mL  30 mL Oral Q4H PRN Charm Rings, NP      . ARIPiprazole (ABILIFY) tablet 5 mg  5 mg Oral QHS Charm Rings, NP   5 mg at 08/05/16 2143  . buPROPion (WELLBUTRIN XL) 24 hr tablet 150 mg  150 mg Oral Daily Craige Cotta, MD   150 mg at 08/06/16 0949  . cholecalciferol (VITAMIN D) tablet 1,000 Units  1,000 Units Oral Daily Craige Cotta, MD   1,000 Units at 08/06/16 0949  . hydrOXYzine (ATARAX/VISTARIL) tablet 10 mg  10 mg Oral TID PRN Charm Rings, NP      . magnesium hydroxide (MILK OF MAGNESIA) suspension 30 mL  30 mL Oral Daily PRN Charm Rings, NP      . traZODone (DESYREL) tablet 50 mg  50 mg Oral QHS PRN Charm Rings, NP   50 mg at 08/05/16 2143  . venlafaxine XR (EFFEXOR-XR) 24 hr capsule 37.5 mg  37.5 mg Oral Q breakfast Craige Cotta, MD   37.5 mg at 08/06/16 1191    PTA Medications: Prescriptions Prior to Admission  Medication Sig Dispense Refill Last Dose  . ARIPiprazole (ABILIFY) 5 MG tablet Take 1 tablet (5 mg total) by mouth at bedtime. 30 tablet 2 07/30/2016 at Unknown time  . hydrOXYzine (ATARAX/VISTARIL) 10 MG tablet TAKE 1 TABLET (10 MG TOTAL) BY MOUTH 3 (THREE) TIMES DAILY AS NEEDED FOR ANXIETY (ANXIETY).  2 Past Month at Unknown time  . traZODone (DESYREL) 50 MG tablet TAKE 1 TABLET BY MOUTH EVERY DAY AT BEDTIME AS NEEDED FOR SLEEP  0 Past Month at Unknown time  . venlafaxine XR (EFFEXOR-XR) 150 MG 24 hr  capsule Take 1 capsule (150 mg total) by mouth daily with breakfast. 30 capsule 2 07/30/2016    Treatment Modalities: Medication Management, Group therapy, Case management,  1 to 1 session with clinician, Psychoeducation, Recreational therapy.  Patient Stressors: Educational concerns Marital or family conflict Medication change or noncompliance  Patient Strengths: Dentist for treatment/growth Physical Health  Physician Treatment Plan for Primary Diagnosis: MDD (major depressive disorder), recurrent severe, without psychosis (HCC) Long Term Goal(s): Improvement in symptoms so as ready for discharge  Short Term Goals: Ability to verbalize feelings will improve Ability to disclose and discuss suicidal ideas Ability to demonstrate self-control will improve Ability to identify and develop effective coping behaviors will improve Ability to maintain clinical measurements within normal limits will improve Ability to verbalize feelings will improve Ability to disclose and discuss suicidal ideas Ability to demonstrate self-control will improve Ability to identify and develop effective coping behaviors will improve Ability to maintain clinical measurements within normal limits will improve  Medication Management: Evaluate patient's response, side effects, and tolerance of medication regimen.  Therapeutic Interventions: 1 to 1 sessions, Unit Group sessions and Medication administration.  Evaluation of Outcomes: Adequate for Discharge  Physician Treatment Plan for Secondary Diagnosis: Principal Problem:   MDD (major depressive disorder), recurrent severe, without psychosis (HCC)   Long Term Goal(s): Improvement in  symptoms so as ready for discharge  Short Term Goals: Ability to verbalize feelings will improve Ability to disclose and discuss suicidal ideas Ability to demonstrate self-control will improve Ability to identify and develop  effective coping behaviors will improve Ability to maintain clinical measurements within normal limits will improve Ability to verbalize feelings will improve Ability to disclose and discuss suicidal ideas Ability to demonstrate self-control will improve Ability to identify and develop effective coping behaviors will improve Ability to maintain clinical measurements within normal limits will improve  Medication Management: Evaluate patient's response, side effects, and tolerance of medication regimen.  Therapeutic Interventions: 1 to 1 sessions, Unit Group sessions and Medication administration.  Evaluation of Outcomes: Adequate for Discharge   RN Treatment Plan for Primary Diagnosis: MDD (major depressive disorder), recurrent severe, without psychosis (HCC) Long Term Goal(s): Knowledge of disease and therapeutic regimen to maintain health will improve  Short Term Goals: Ability to verbalize feelings will improve, Ability to disclose and discuss suicidal ideas and Ability to identify and develop effective coping behaviors will improve  Medication Management: RN will administer medications as ordered by provider, will assess and evaluate patient's response and provide education to patient for prescribed medication. RN will report any adverse and/or side effects to prescribing provider.  Therapeutic Interventions: 1 on 1 counseling sessions, Psychoeducation, Medication administration, Evaluate responses to treatment, Monitor vital signs and CBGs as ordered, Perform/monitor CIWA, COWS, AIMS and Fall Risk screenings as ordered, Perform wound care treatments as ordered.  Evaluation of Outcomes: Adequate for Discharge   LCSW Treatment Plan for Primary Diagnosis: MDD (major depressive disorder), recurrent severe, without psychosis (HCC) Long Term Goal(s): Safe transition to appropriate next level of care at discharge, Engage patient in therapeutic group addressing interpersonal  concerns.  Short Term Goals: Engage patient in aftercare planning with referrals and resources, Identify triggers associated with mental health/substance abuse issues and Increase skills for wellness and recovery  Therapeutic Interventions: Assess for all discharge needs, 1 to 1 time with Social worker, Explore available resources and support systems, Assess for adequacy in community support network, Educate family and significant other(s) on suicide prevention, Complete Psychosocial Assessment, Interpersonal group therapy.  Evaluation of Outcomes: Adequate for Discharge   Progress in Treatment: Attending groups: Yes Participating in groups: Yes  Taking medication as prescribed: Yes, MD continues to assess for medication changes as needed Toleration medication: Yes, no side effects reported at this time Family/Significant other contact made: Yes with mother Patient understands diagnosis: Continuing to assess Discussing patient identified problems/goals with staff: Yes Medical problems stabilized or resolved: Yes Denies suicidal/homicidal ideation: Yes Issues/concerns per patient self-inventory: None Other: N/A  New problem(s) identified: None identified at this time.   New Short Term/Long Term Goal(s): None identified at this time.   Discharge Plan or Barriers: Pt will return home and follow-up with outpatient services.   Reason for Continuation of Hospitalization: None identified at this time.   Estimated Length of Stay: 0 days  Attendees: Patient: 08/06/2016  11:23 AM  Physician: Dr. Jama Flavorsobos 08/06/2016  11:23 AM  Nursing: Lisbeth RenshawJan Wright, Patricia Duke, RN 08/06/2016  11:23 AM  RN Care Manager: Onnie BoerJennifer Clark, RN 08/06/2016  11:23 AM  Social Worker: Vernie ShanksLauren Nehemyah Foushee, LCSW; Heather Smart, LCSW 08/06/2016  11:23 AM  Recreational Therapist:  08/06/2016  11:23 AM  Other: Armandina StammerAgnes Nwoko, NP; Malachy Chamberakia Starkes, NP 08/06/2016  11:23 AM  Other:  08/06/2016  11:23 AM  Other: 08/06/2016  11:23 AM     Scribe for Treatment Team: Leotis ShamesLauren  Deloria Lair, LCSW 08/06/2016 11:23 AM

## 2016-08-06 NOTE — Progress Notes (Signed)
  [facility] Discharge Plan :  Will you be returning to the same living situation after discharge:  Yes,  Pt returning home with family At discharge, do you have transportation home?: Yes,  pt mother or grandfather to pick up Do you have the ability to pay for your medications: Yes,  Pt provided with prescriptions  Release of information consent forms completed and in the chart;  Patient's signature needed at discharge.  Patient to Follow up at: Follow-up Information    [facility] Follow up.   Specialty:  Behavioral Health Why:  1/18 [facility] 1:00pm [facility]. 1/25 @ 10:30am with Dr. Selinda MichaelsArgawahl.  Contact information: [address] [place] [id number] [phone]          Next level of care provider has [facility] Link:yes  Safety Planning and Suicide Prevention discussed: Yes,  with mother; see SPE note  Have you used any form of tobacco in the last 30 days? (Cigarettes, Smokeless Tobacco, Cigars, and/or Pipes): No  Has patient been referred to the Quitline?: N/A patient is not a smoker  Patient has been referred for addiction treatment: N/A  Verdene LennertLauren C Nakeita Styles 08/06/2016, 11:26 AM

## 2016-08-12 ENCOUNTER — Ambulatory Visit (HOSPITAL_COMMUNITY): Payer: PRIVATE HEALTH INSURANCE | Admitting: Psychology

## 2016-08-12 ENCOUNTER — Ambulatory Visit (HOSPITAL_COMMUNITY): Payer: Self-pay | Admitting: Licensed Clinical Social Worker

## 2016-08-19 ENCOUNTER — Ambulatory Visit (HOSPITAL_COMMUNITY): Payer: PRIVATE HEALTH INSURANCE | Admitting: Psychiatry

## 2016-08-30 ENCOUNTER — Other Ambulatory Visit (HOSPITAL_COMMUNITY): Payer: Self-pay

## 2016-08-30 MED ORDER — BUPROPION HCL ER (XL) 150 MG PO TB24: 150.0000 mg | ORAL_TABLET | Freq: Every day | ORAL | 0 refills | Status: DC

## 2016-08-30 MED ORDER — ARIPIPRAZOLE 5 MG PO TABS: 5.0000 mg | ORAL_TABLET | Freq: Every day | ORAL | 0 refills | Status: DC

## 2016-08-30 MED ORDER — HYDROXYZINE HCL 10 MG PO TABS: 10.0000 mg | ORAL_TABLET | Freq: Three times a day (TID) | ORAL | 0 refills | Status: DC | PRN

## 2016-08-30 MED ORDER — TRAZODONE HCL 50 MG PO TABS: 50.0000 mg | ORAL_TABLET | Freq: Every evening | ORAL | 0 refills | Status: DC | PRN

## 2016-09-20 ENCOUNTER — Encounter: Payer: Self-pay | Admitting: Nurse Practitioner

## 2016-09-20 ENCOUNTER — Ambulatory Visit (INDEPENDENT_AMBULATORY_CARE_PROVIDER_SITE_OTHER): Payer: PRIVATE HEALTH INSURANCE | Admitting: Nurse Practitioner

## 2016-09-20 VITALS — BP 90/64 | HR 64 | Ht 60.75 in | Wt 137.0 lb

## 2016-09-20 DIAGNOSIS — N912 Amenorrhea, unspecified: Secondary | ICD-10-CM | POA: Diagnosis not present

## 2016-09-20 DIAGNOSIS — Z0000 Encounter for general adult medical examination without abnormal findings: Secondary | ICD-10-CM | POA: Diagnosis not present

## 2016-09-20 DIAGNOSIS — Z01411 Encounter for gynecological examination (general) (routine) with abnormal findings: Secondary | ICD-10-CM

## 2016-09-20 LAB — TSH: TSH: 1.09 m[IU]/L (ref 0.50–4.30)

## 2016-09-20 LAB — POCT URINE PREGNANCY: Preg Test, Ur: NEGATIVE

## 2016-09-20 MED ORDER — DROSPIRENONE-ETHINYL ESTRADIOL 3-0.02 MG PO TABS: 1.0000 | ORAL_TABLET | Freq: Every day | ORAL | 4 refills | Status: DC

## 2016-09-20 MED ORDER — MEDROXYPROGESTERONE ACETATE 5 MG PO TABS: 5.0000 mg | ORAL_TABLET | Freq: Every day | ORAL | 0 refills | Status: DC

## 2016-09-20 NOTE — Progress Notes (Signed)
Encounter reviewed by Dr. Janean SarkBrook Amundson C. Silva. UPT today negative.

## 2016-09-20 NOTE — Patient Instructions (Signed)
General topics  Next pap or exam is  due in 1 year Take a Women's multivitamin Take 1200 mg. of calcium daily - prefer dietary If any concerns in interim to call back  Breast Self-Awareness Practicing breast self-awareness may pick up problems early, prevent significant medical complications, and possibly save your life. By practicing breast self-awareness, you can become familiar with how your breasts look and feel and if your breasts are changing. This allows you to notice changes early. It can also offer you some reassurance that your breast health is good. One way to learn what is normal for your breasts and whether your breasts are changing is to do a breast self-exam. If you find a lump or something that was not present in the past, it is best to contact your caregiver right away. Other findings that should be evaluated by your caregiver include nipple discharge, especially if it is bloody; skin changes or reddening; areas where the skin seems to be pulled in (retracted); or new lumps and bumps. Breast pain is seldom associated with cancer (malignancy), but should also be evaluated by a caregiver. BREAST SELF-EXAM The best time to examine your breasts is 5 7 days after your menstrual period is over.  ExitCare Patient Information 2013 [employer].   Exercise to Stay Healthy Exercise helps you become and stay healthy. EXERCISE IDEAS AND TIPS Choose exercises that:  You enjoy.  Fit into your day. You do not need to exercise really hard to be healthy. You can do exercises at a slow or medium level and stay healthy. You can:  Stretch before and after working out.  Try yoga, Pilates, or tai chi.  Lift weights.  Walk fast, swim, jog, run, climb stairs, bicycle, dance, or rollerskate.  Take aerobic classes. Exercises that burn about 150 calories:  Running 1  miles in 15 minutes.  Playing volleyball for 45 to 60 minutes.  Washing and waxing a car for 45 to 60  minutes.  Playing touch football for 45 minutes.  Walking 1  miles in 35 minutes.  Pushing a stroller 1  miles in 30 minutes.  Playing basketball for 30 minutes.  Raking leaves for 30 minutes.  Bicycling 5 miles in 30 minutes.  Walking 2 miles in 30 minutes.  Dancing for 30 minutes.  Shoveling snow for 15 minutes.  Swimming laps for 20 minutes.  Walking up stairs for 15 minutes.  Bicycling 4 miles in 15 minutes.  Gardening for 30 to 45 minutes.  Jumping rope for 15 minutes.  Washing windows or floors for 45 to 60 minutes. Document Released: 08/14/2010 Document Revised: 10/04/2011 Document Reviewed: 08/14/2010 ExitCare Patient Information 2013 [employer].   Other topics ( that may be useful information):    Sexually Transmitted Disease Sexually transmitted disease (STD) refers to any infection that is passed from person to person during sexual activity. This may happen by way of saliva, semen, blood, vaginal mucus, or urine. Common STDs include:  Gonorrhea.  Chlamydia.  Syphilis.  HIV/AIDS.  Genital herpes.  Hepatitis B and C.  Trichomonas.  Human papillomavirus (HPV).  Pubic lice. CAUSES  An STD may be spread by bacteria, virus, or parasite. A person can get an STD by:  Sexual intercourse with an infected person.  Sharing sex toys with an infected person.  Sharing needles with an infected person.  Having intimate contact with the genitals, mouth, or rectal areas of an infected person. SYMPTOMS  Some people may not have any symptoms, but   they can still pass the infection to others. Different STDs have different symptoms. Symptoms include:  Painful or bloody urination.  Pain in the pelvis, abdomen, vagina, anus, throat, or eyes.  Skin rash, itching, irritation, growths, or sores (lesions). These usually occur in the genital or anal area.  Abnormal vaginal discharge.  Penile discharge in men.  Soft, flesh-colored skin growths in the  genital or anal area.  Fever.  Pain or bleeding during sexual intercourse.  Swollen glands in the groin area.  Yellow skin and eyes (jaundice). This is seen with hepatitis. DIAGNOSIS  To make a diagnosis, your caregiver may:  Take a medical history.  Perform a physical exam.  Take a specimen (culture) to be examined.  Examine a sample of discharge under a microscope.  Perform blood test TREATMENT   Chlamydia, gonorrhea, trichomonas, and syphilis can be cured with antibiotic medicine.  Genital herpes, hepatitis, and HIV can be treated, but not cured, with prescribed medicines. The medicines will lessen the symptoms.  Genital warts from HPV can be treated with medicine or by freezing, burning (electrocautery), or surgery. Warts may come back.  HPV is a virus and cannot be cured with medicine or surgery.However, abnormal areas may be followed very closely by your caregiver and may be removed from the cervix, vagina, or vulva through office procedures or surgery. If your diagnosis is confirmed, your recent sexual partners need treatment. This is true even if they are symptom-free or have a negative culture or evaluation. They should not have sex until their caregiver says it is okay. HOME CARE INSTRUCTIONS  All sexual partners should be informed, tested, and treated for all STDs.  Take your antibiotics as directed. Finish them even if you start to feel better.  Only take over-the-counter or prescription medicines for pain, discomfort, or fever as directed by your caregiver.  Rest.  Eat a balanced diet and drink enough fluids to keep your urine clear or pale yellow.  Do not have sex until treatment is completed and you have followed up with your caregiver. STDs should be checked after treatment.  Keep all follow-up appointments, Pap tests, and blood tests as directed by your caregiver.  Only use latex condoms and water-soluble lubricants during sexual activity. Do not use  petroleum jelly or oils.  Avoid alcohol and illegal drugs.  Get vaccinated for HPV and hepatitis. If you have not received these vaccines in the past, talk to your caregiver about whether one or both might be right for you.  Avoid risky sex practices that can break the skin. The only way to avoid getting an STD is to avoid all sexual activity.Latex condoms and dental dams (for oral sex) will help lessen the risk of getting an STD, but will not completely eliminate the risk. SEEK MEDICAL CARE IF:   You have a fever.  You have any new or worsening symptoms. Document Released: 10/02/2002 Document Revised: 10/04/2011 Document Reviewed: 10/09/2010 [facility] Patient Information 2013 [employer].    Domestic Abuse You are being battered or abused if someone close to you hits, pushes, or physically hurts you in any way. You also are being abused if you are forced into activities. You are being sexually abused if you are forced to have sexual contact of any kind. You are being emotionally abused if you are made to feel worthless or if you are constantly threatened. It is important to remember that help is available. No one has the right to abuse you. PREVENTION OF FURTHER  ABUSE  Learn the warning signs of danger. This varies with situations but may include: the use of alcohol, threats, isolation from friends and family, or forced sexual contact. Leave if you feel that violence is going to occur.  If you are attacked or beaten, report it to the police so the abuse is documented. You do not have to press charges. The police can protect you while you or the attackers are leaving. Get the officer's name and badge number and a copy of the report.  Find someone you can trust and tell them what is happening to you: your caregiver, a nurse, clergy member, close friend or family member. Feeling ashamed is natural, but remember that you have done nothing wrong. No one deserves abuse. Document Released:  07/09/2000 Document Revised: 10/04/2011 Document Reviewed: 09/17/2010 ExitCare Patient Information 2013 [employer].    How Much is Too Much Alcohol? Drinking too much alcohol can cause injury, accidents, and health problems. These types of problems can include:   Car crashes.  Falls.  Family fighting (domestic violence).  Drowning.  Fights.  Injuries.  Burns.  Damage to certain organs.  Having a baby with birth defects. ONE DRINK CAN BE TOO MUCH WHEN YOU ARE:  Working.  Pregnant or breastfeeding.  Taking medicines. Ask your doctor.  Driving or planning to drive. If you or someone you know has a drinking problem, get help from a doctor.  Document Released: 05/08/2009 Document Revised: 10/04/2011 Document Reviewed: 05/08/2009 ExitCare Patient Information 2013 [employer].   Smoking Hazards Smoking cigarettes is extremely bad for your health. Tobacco smoke has over 200 known poisons in it. There are over 60 chemicals in tobacco smoke that cause cancer. Some of the chemicals found in cigarette smoke include:   Cyanide.  Benzene.  Formaldehyde.  Methanol (wood alcohol).  Acetylene (fuel used in welding torches).  Ammonia. Cigarette smoke also contains the poisonous gases nitrogen oxide and carbon monoxide.  Cigarette smokers have an increased risk of many serious medical problems and Smoking causes approximately:  90% of all lung cancer deaths in men.  80% of all lung cancer deaths in women.  90% of deaths from chronic obstructive lung disease. Compared with nonsmokers, smoking increases the risk of:  Coronary heart disease by 2 to 4 times.  Stroke by 2 to 4 times.  Men developing lung cancer by 23 times.  Women developing lung cancer by 13 times.  Dying from chronic obstructive lung diseases by 12 times.  . Smoking is the most preventable cause of death and disease in our society.  WHY IS SMOKING ADDICTIVE?  Nicotine is the chemical  agent in tobacco that is capable of causing addiction or dependence.  When you smoke and inhale, nicotine is absorbed rapidly into the bloodstream through your lungs. Nicotine absorbed through the lungs is capable of creating a powerful addiction. Both inhaled and non-inhaled nicotine may be addictive.  Addiction studies of cigarettes and spit tobacco show that addiction to nicotine occurs mainly during the teen years, when young people begin using tobacco products. WHAT ARE THE BENEFITS OF QUITTING?  There are many health benefits to quitting smoking.   Likelihood of developing cancer and heart disease decreases. Health improvements are seen almost immediately.  Blood pressure, pulse rate, and breathing patterns start returning to normal soon after quitting. QUITTING SMOKING   American Lung Association - 1-800-LUNGUSA  American Cancer Society - 1-800-ACS-2345 Document Released: 08/19/2004 Document Revised: 10/04/2011 Document Reviewed: 04/23/2009 ExitCare Patient Information 2013 ExitCare,   LLC.   Stress Management Stress is a state of physical or mental tension that often results from changes in your life or normal routine. Some common causes of stress are:  Death of a loved one.  Injuries or severe illnesses.  Getting fired or changing jobs.  Moving into a new home. Other causes may be:  Sexual problems.  Business or financial losses.  Taking on a large debt.  Regular conflict with someone at home or at work.  Constant tiredness from lack of sleep. It is not just bad things that are stressful. It may be stressful to:  Win the lottery.  Get married.  Buy a new car. The amount of stress that can be easily tolerated varies from person to person. Changes generally cause stress, regardless of the types of change. Too much stress can affect your health. It may lead to physical or emotional problems. Too little stress (boredom) may also become stressful. SUGGESTIONS TO  REDUCE STRESS:  Talk things over with your family and friends. It often is helpful to share your concerns and worries. If you feel your problem is serious, you may want to get help from a professional counselor.  Consider your problems one at a time instead of lumping them all together. Trying to take care of everything at once may seem impossible. List all the things you need to do and then start with the most important one. Set a goal to accomplish 2 or 3 things each day. If you expect to do too many in a single day you will naturally fail, causing you to feel even more stressed.  Do not use alcohol or drugs to relieve stress. Although you may feel better for a short time, they do not remove the problems that caused the stress. They can also be habit forming.  Exercise regularly - at least 3 times per week. Physical exercise can help to relieve that "uptight" feeling and will relax you.  The shortest distance between despair and hope is often a good night's sleep.  Go to bed and get up on time allowing yourself time for appointments without being rushed.  Take a short "time-out" period from any stressful situation that occurs during the day. Close your eyes and take some deep breaths. Starting with the muscles in your face, tense them, hold it for a few seconds, then relax. Repeat this with the muscles in your neck, shoulders, hand, stomach, back and legs.  Take good care of yourself. Eat a balanced diet and get plenty of rest.  Schedule time for having fun. Take a break from your daily routine to relax. HOME CARE INSTRUCTIONS   Call if you feel overwhelmed by your problems and feel you can no longer manage them on your own.  Return immediately if you feel like hurting yourself or someone else. Document Released: 01/05/2001 Document Revised: 10/04/2011 Document Reviewed: 08/28/2007 ExitCare Patient Information 2013 ExitCare, [employer].  

## 2016-09-20 NOTE — Progress Notes (Signed)
21 y.o. G0P0000 Single  Caucasian Fe here for annual exam.  This past year menses have been every 2-3 months.  This past menses in January with a heavy cycle and lasted about 6 days.  Thinks prior menses in November.  Last SA with female partner 2 yrs.  Now with new partner - boyfriend but not SA as relationship on long distance.  In the past was on [place] and [place].  Has been seen as [facility] on 2 occasions:   November 7/17 & 08/02/2016.  In hospital each occurrence x 1 week.  Now on new medication and sees psychiatrist.   Patient's last menstrual period was 07/31/2016 (exact date).          Sexually active: Yes.   Not currently. The current method of family planning is condoms all of the time.    Exercising: No.  The patient does not participate in regular exercise at present. Smoker:  no  Health Maintenance: Pap: None per guidelines TDaP: 12/2010 HIV: 08/27/15 Labs: UPT:    reports that she has never smoked. She has never used smokeless tobacco. She reports that she does not drink alcohol or use drugs.  Past Medical History:  Diagnosis Date  . Anxiety   . Depression   . Multiple body piercings    nose, ears  . PTSD (post-traumatic stress disorder)   . Vision abnormalities    corrective lenses    Past Surgical History:  Procedure Laterality Date  . LAPAROSCOPIC CHOLECYSTECTOMY SINGLE PORT N/A 03/04/2014   Procedure: LAPAROSCOPIC CHOLECYSTECTOMY SINGLE PORT;  Surgeon: Ardeth SportsmanSteven C. Gross, MD;  Location: [facility];  Service: General;  Laterality: N/A;    Current Outpatient Prescriptions  Medication Sig Dispense Refill  . ARIPiprazole (ABILIFY) 5 MG tablet Take 1 tablet (5 mg total) by mouth at bedtime. For mood control 30 tablet 0  . buPROPion (WELLBUTRIN XL) 150 MG 24 hr tablet Take 1 tablet (150 mg total) by mouth daily. For depression 30 tablet 0  . cholecalciferol (VITAMIN D) 1000 units tablet Take 1 tablet (1,000 Units total) by mouth daily. For bone health 30 tablet 0   . hydrOXYzine (ATARAX/VISTARIL) 10 MG tablet Take 1 tablet (10 mg total) by mouth 3 (three) times daily as needed for anxiety. 60 tablet 0  . traZODone (DESYREL) 50 MG tablet Take 1 tablet (50 mg total) by mouth at bedtime as needed for sleep. 30 tablet 0   No current facility-administered medications for this visit.     Family History  Problem Relation Age of Onset  . Depression Mother   . Varicose Veins Mother   . Asthma Brother   . Alcohol abuse Father   . Anxiety disorder Father   . COPD Paternal Grandmother     ROS:  Pertinent items are noted in HPI.  Otherwise, a comprehensive ROS was negative.  Exam:   BP 90/64 (BP Location: Right Arm, Patient Position: Sitting, Cuff Size: Normal)   Pulse 64   Ht 5' 0.75" (1.543 m)   Wt 137 lb (62.1 kg)   LMP 07/31/2016 (Exact Date) Comment: irregular  BMI 26.10 kg/m  Height: 5' 0.75" (154.3 cm) Ht Readings from Last 3 Encounters:  09/20/16 5' 0.75" (1.543 m) (8 %, Z= -1.39)*  08/27/15 5' 1.25" (1.556 m) (12 %, Z= -1.18)*  03/18/14 5' 1.5" (1.562 m) (15 %, Z= -1.04)*   * Growth percentiles are based on CDC 2-20 Years data.    General appearance: alert, cooperative and appears  stated age Head: Normocephalic, without obvious abnormality, atraumatic Neck: no adenopathy, supple, symmetrical, trachea midline and thyroid normal to inspection and palpation Lungs: clear to auscultation bilaterally Breasts: normal appearance, no masses or tenderness Heart: regular rate and rhythm Abdomen: soft, non-tender; no masses,  no organomegaly Extremities: extremities normal, atraumatic, no cyanosis or edema Skin: Skin color, texture, turgor normal. No rashes or lesions Lymph nodes: Cervical, supraclavicular, and axillary nodes normal. No abnormal inguinal nodes palpated Neurologic: Grossly normal   Pelvic: External genitalia:  no lesions              Urethra:  normal appearing urethra with no masses, tenderness or lesions               Bartholin's and Skene's: normal                 Vagina: normal appearing vagina with normal color and discharge, no lesions              Cervix: anteverted              Pap taken: No. Bimanual Exam:  Uterus:  normal size, contour, position, consistency, mobility, non-tender              Adnexa: no mass, fullness, tenderness                        Chaperone present: yes  A:  Well Woman with normal exam    History of irregular menses without diagnosis of PCOS             History of cystic acne and prior use of OCP - off ~ 1 yr and cycles irregular again             History of anxiety/ depression and PTSD - in therapy             History of chronic fatigue             Recent Psych admission X 2 11/17 & 1/18                            P:   Reviewed health and wellness pertinent to exam  Pap smear not done  Declines need for STD's - not SA since last tested a year ago.  Will give her Provera challenge X 5 days. Then start on Yaz daily as directed.  If no menses to call back after Provera.  Counseled with BUM, start date, compliance, and potential SE.  Will check TSH and Prolactin level  Counseled on breast self exam, STD prevention, HIV risk factors and prevention, use and side effects of OCP's, adequate intake of calcium and vitamin D, diet and exercise return annually or prn  An After Visit Summary was printed and given to the patient.

## 2016-09-21 LAB — PROLACTIN: PROLACTIN: 11.6 ng/mL

## 2016-09-22 ENCOUNTER — Telehealth: Payer: Self-pay | Admitting: *Deleted

## 2016-09-22 NOTE — Telephone Encounter (Signed)
-----   Message from Ria CommentPatricia Grubb, FNP sent at 09/21/2016  7:42 AM EST ----- Please let pt know that both prolactin and TSH is normal and not the cause of her amenorrhea.  She is to continue as planned with Provera.

## 2016-09-22 NOTE — Telephone Encounter (Signed)
I have attempted to contact this patient by phone with the following results: left message to return call to ChandlerStephanie at [phone] on answering machine (home/mobile per DPR). Name verified in voicemail. Advised call was regarding recent labs. [phone] (Home) *Preferred*

## 2016-09-28 ENCOUNTER — Ambulatory Visit (HOSPITAL_COMMUNITY): Payer: Self-pay | Admitting: Psychiatry

## 2016-09-28 ENCOUNTER — Other Ambulatory Visit (HOSPITAL_COMMUNITY): Payer: Self-pay | Admitting: Psychiatry

## 2016-09-29 NOTE — Telephone Encounter (Signed)
I have attempted to contact this patient by phone with the following results: left message to return call to Estoria Geary at [phone] on answering machine (home/mobile per DPR). Name verified in voicemail. Advised call was regarding recent labs. [phone] (Home) *Preferred* 

## 2016-10-05 NOTE — Telephone Encounter (Signed)
Patient notified of lab results.    States she finished Provera and started bleeding on 10/02/16.  She will call if she does not have a regular menses or has other concerns.

## 2016-10-06 ENCOUNTER — Telehealth: Payer: Self-pay | Admitting: Nurse Practitioner

## 2016-10-06 NOTE — Telephone Encounter (Signed)
Patient called and requested an appointment for a "urinary tract infection." As it is late in the day, she'd like a call from the nurse to talk about her symptoms.

## 2016-10-06 NOTE — Telephone Encounter (Signed)
Left message to call Kaitlyn at [phone]. 

## 2016-10-12 ENCOUNTER — Telehealth (HOSPITAL_COMMUNITY): Payer: Self-pay

## 2016-10-12 NOTE — Telephone Encounter (Signed)
Patients caseworker with her insurance called, she has been in touch with patient, she said that the patient is having passive suicidal thoughts but was able to contract for safety. She said her depression is about a 4 out of 10. I just wanted you to be aware for her appointment today.

## 2016-10-14 ENCOUNTER — Encounter (HOSPITAL_COMMUNITY): Payer: Self-pay | Admitting: Psychiatry

## 2016-10-14 ENCOUNTER — Ambulatory Visit (INDEPENDENT_AMBULATORY_CARE_PROVIDER_SITE_OTHER): Payer: PRIVATE HEALTH INSURANCE | Admitting: Psychiatry

## 2016-10-14 VITALS — BP 112/68 | HR 102 | Ht 61.0 in | Wt 138.4 lb

## 2016-10-14 DIAGNOSIS — Z818 Family history of other mental and behavioral disorders: Secondary | ICD-10-CM | POA: Diagnosis not present

## 2016-10-14 DIAGNOSIS — F4310 Post-traumatic stress disorder, unspecified: Secondary | ICD-10-CM

## 2016-10-14 DIAGNOSIS — Z811 Family history of alcohol abuse and dependence: Secondary | ICD-10-CM | POA: Diagnosis not present

## 2016-10-14 DIAGNOSIS — Z79899 Other long term (current) drug therapy: Secondary | ICD-10-CM

## 2016-10-14 DIAGNOSIS — F411 Generalized anxiety disorder: Secondary | ICD-10-CM

## 2016-10-14 DIAGNOSIS — F422 Mixed obsessional thoughts and acts: Secondary | ICD-10-CM | POA: Diagnosis not present

## 2016-10-14 DIAGNOSIS — F332 Major depressive disorder, recurrent severe without psychotic features: Secondary | ICD-10-CM

## 2016-10-14 DIAGNOSIS — R45851 Suicidal ideations: Secondary | ICD-10-CM | POA: Diagnosis not present

## 2016-10-14 DIAGNOSIS — F4010 Social phobia, unspecified: Secondary | ICD-10-CM | POA: Diagnosis not present

## 2016-10-14 DIAGNOSIS — Z888 Allergy status to other drugs, medicaments and biological substances status: Secondary | ICD-10-CM

## 2016-10-14 MED ORDER — BUPROPION HCL ER (XL) 150 MG PO TB24: ORAL_TABLET | ORAL | 1 refills | Status: DC

## 2016-10-14 MED ORDER — ARIPIPRAZOLE 10 MG PO TABS: 10.0000 mg | ORAL_TABLET | Freq: Every day | ORAL | 1 refills | Status: DC

## 2016-10-14 NOTE — Progress Notes (Signed)
BH MD/PA/NP OP Progress Note  10/14/2016 8:37 AM Jo Haas  MRN:  [id number]  Chief Complaint: I have been super anxious   HPI: Pt reports she has constant anxiety. She worries about her performance in school and the future. She has a big exam today but was able to study for it. Anxiety causes a lot of racing thoughts, inability to relax, crying and some fatigue. Pt has random stress induced panic attacks. States she stopped Vistaril as it was not effective.   Pt is not engaging in any social activities. Social anxiety is preventing her from engaging with her professors.  PTSD- mild. Denies nightmares. She has occasional intrusive memories and some HV. Pt is learning to drive.  Depression is mild but she is not focused on it. Pt has more energy to do some activities she enjoys. Pt reports ongoing worthlessness. She reports on/off passive SI without plan or intent. She has also has random thoughts of SIB but reports she has no intention or desire to engage in it-"it's not worth the effort".  Sleep is good and she is getting about 12 hrs/night. Pt is not taking Trazodone.  Taking meds as prescribed and denies SE. States Wellbutrin is helping her be more active and getting things done.      Visit Diagnosis: No diagnosis found.  Past Psychiatric History: see H&P  Past Medical History:  Past Medical History:  Diagnosis Date  . Anxiety   . Depression   . Multiple body piercings    nose, ears  . PTSD (post-traumatic stress disorder)   . Vision abnormalities    corrective lenses    Past Surgical History:  Procedure Laterality Date  . LAPAROSCOPIC CHOLECYSTECTOMY SINGLE PORT N/A 03/04/2014   Procedure: LAPAROSCOPIC CHOLECYSTECTOMY SINGLE PORT;  Surgeon: Ardeth Sportsman, MD;  Location: [facility];  Service: General;  Laterality: N/A;    Family Psychiatric History:  Family History  Problem Relation Age of Onset  . Depression Mother   . Varicose Veins Mother   . Asthma Brother    . Alcohol abuse Father   . Anxiety disorder Father   . COPD Paternal Grandmother     Social History:  Social History   Social History  . Marital status: Single    Spouse name: N/A  . Number of children: N/A  . Years of education: N/A   Social History Main Topics  . Smoking status: Never Smoker  . Smokeless tobacco: Never Used  . Alcohol use No  . Drug use: No  . Sexual activity: No   Other Topics Concern  . None   Social History Narrative  . None    Allergies:  Allergies  Allergen Reactions  . Amoxicillin Swelling    .Marland KitchenHas patient had a PCN reaction causing immediate rash, facial/tongue/throat swelling, SOB or lightheadedness with hypotension: UNKNOWN Has patient had a PCN reaction causing severe rash involving mucus membranes or skin necrosis: UNKNOWN Has patient had a PCN reaction that required hospitalization UNKNOWN Has patient had a PCN reaction occurring within the last 10 years: Yes - infant allergy If all of the above answers are "NO", then may proceed with Cephalosporin use.   . Pollen Extract Swelling    Food containing pollen    Metabolic Disorder Labs: Lab Results  Component Value Date   HGBA1C 4.9 06/03/2016   MPG 94 06/03/2016   Lab Results  Component Value Date   PROLACTIN 11.6 09/20/2016   Lab Results  Component Value Date   CHOL  161 06/03/2016   TRIG 96 06/03/2016   HDL 44 06/03/2016   CHOLHDL 3.7 06/03/2016   VLDL 19 06/03/2016   LDLCALC 98 06/03/2016     Current Medications: Current Outpatient Prescriptions  Medication Sig Dispense Refill  . ARIPiprazole (ABILIFY) 5 MG tablet Take 1 tablet (5 mg total) by mouth at bedtime. For mood control 30 tablet 0  . buPROPion (WELLBUTRIN XL) 150 MG 24 hr tablet TAKE 1 TABLET BY MOUTH EVERY DAY FOR DEPRESSION 30 tablet 0  . cholecalciferol (VITAMIN D) 1000 units tablet Take 1 tablet (1,000 Units total) by mouth daily. For bone health 30 tablet 0  . drospirenone-ethinyl estradiol  (YAZ,GIANVI,LORYNA) 3-0.02 MG tablet Take 1 tablet by mouth daily. 3 Package 4  . medroxyPROGESTERone (PROVERA) 5 MG tablet Take 1 tablet (5 mg total) by mouth daily. 5 tablet 0  . hydrOXYzine (ATARAX/VISTARIL) 10 MG tablet Take 1 tablet (10 mg total) by mouth 3 (three) times daily as needed for anxiety. (Patient not taking: Reported on 10/14/2016) 60 tablet 0  . traZODone (DESYREL) 50 MG tablet Take 1 tablet (50 mg total) by mouth at bedtime as needed for sleep. (Patient not taking: Reported on 10/14/2016) 30 tablet 0   No current facility-administered medications for this visit.    Musculoskeletal: Strength & Muscle Tone: within normal limits Gait & Station: normal Patient leans: N/A  Psychiatric Specialty Exam: Review of Systems  Gastrointestinal: Negative for abdominal pain, diarrhea, heartburn, nausea and vomiting.  Musculoskeletal: Negative for back pain, joint pain and neck pain.  Neurological: Negative for dizziness, tremors, sensory change, seizures, loss of consciousness and headaches.  Psychiatric/Behavioral: Positive for depression and suicidal ideas. Negative for hallucinations and substance abuse. The patient is nervous/anxious. The patient does not have insomnia.     Blood pressure 112/68, pulse (!) 102, height 5\' 1"  (1.549 m), weight 138 lb 6.4 oz (62.8 kg).Body mass index is 26.15 kg/m.  General Appearance: Casual  Eye Contact:  Minimal  Speech:  Clear and Coherent and Normal Rate  Volume:  Normal  Mood:  Anxious and Depressed  Affect:  Congruent  Thought Process:  Goal Directed and Descriptions of Associations: Intact  Orientation:  Full (Time, Place, and Person)  Thought Content: WDL   Suicidal Thoughts:  Yes.  without intent/plan  Homicidal Thoughts:  No  Memory:  Immediate;   Good Recent;   Good Remote;   Good  Judgement:  Good  Insight:  Good  Psychomotor Activity:  Normal  Concentration:  Concentration: Good and Attention Span: Good  Recall:  Good  Fund of  Knowledge: Good  Language: Good  Akathisia:  No  Handed:  Right  AIMS (if indicated):  AIMS:  Facial and Oral Movements  Muscles of Facial Expression: None, normal  Lips and Perioral Area: None, normal  Jaw: None, normal  Tongue: None, normal Extremity Movements: Upper (arms, wrists, hands, fingers): None, normal  Lower (legs, knees, ankles, toes): None, normal,  Trunk Movements:  Neck, shoulders, hips: None, normal,  Overall Severity : Severity of abnormal movements (highest score from questions above): None, normal  Incapacitation due to abnormal movements: None, normal  Patient's awareness of abnormal movements (rate only patient's report): No Awareness, Dental Status  Current problems with teeth and/or dentures?: No  Does patient usually wear dentures?: No     Assets:  Desire for Improvement Housing Leisure Time Physical Health Resilience Social Support Talents/Skills  ADL's:  Intact  Cognition: WNL  Sleep:  good  Treatment Plan Summary:Medication management and Plan see below  Assessment: GAD; MDD- recurrent, severe without psychotic features; PTSD; Social anxiety disorder; OCD   Medication management with supportive therapy. Risks/benefits and SE of the medication discussed. Pt verbalized understanding and verbal consent obtained for treatment.  Affirm with the patient that the medications are taken as ordered. Patient expressed understanding of how their medications were to be used.  Meds: D/c Vistaril and Trazodone as pt reports they are ineffective  Increase Abilify to 10mg  po qD for ongoing depression, GAD, social anxiety  and PTSD Continue Wellbutrin XL 150mg  po qD for ongoing depression   Labs: Prolactin and TSH WNL on 09/20/2016 EKG on 08/02/2016 QTc 421   Therapy: brief supportive therapy provided. Discussed psychosocial stressors in detail.     Consultations:  Pt is seeing a counselor at her school once a week.  Pt denies SI and is at an acute low  risk for suicide. Patient told to call clinic if any problems occur. Patient advised to go to ER if they should develop SI/HI, side effects, or if symptoms worsen. Has crisis numbers to call if needed. Pt verbalized understanding.  F/up in 2 months or sooner if needed   Oletta DarterSalina Kalicia Dufresne, MD 10/14/2016, 8:37 AM

## 2016-10-19 NOTE — Telephone Encounter (Signed)
Left message to call Jo Haas at [phone]. 

## 2016-10-27 NOTE — Telephone Encounter (Signed)
Ok to close

## 2016-10-27 NOTE — Telephone Encounter (Signed)
Leota Sauers CNM I have attempted to reach this patient x 2 with no return call. Okay to close encounter?

## 2016-10-27 NOTE — Telephone Encounter (Signed)
Encounter closed per Deborah Leonard, CNM.  

## 2016-12-23 ENCOUNTER — Ambulatory Visit (HOSPITAL_COMMUNITY): Payer: Self-pay | Admitting: Psychiatry

## 2017-01-10 ENCOUNTER — Other Ambulatory Visit (HOSPITAL_COMMUNITY): Payer: Self-pay | Admitting: Psychiatry

## 2017-01-10 DIAGNOSIS — F411 Generalized anxiety disorder: Secondary | ICD-10-CM

## 2017-01-10 DIAGNOSIS — F4010 Social phobia, unspecified: Secondary | ICD-10-CM

## 2017-01-10 DIAGNOSIS — F332 Major depressive disorder, recurrent severe without psychotic features: Secondary | ICD-10-CM

## 2017-01-10 DIAGNOSIS — F4310 Post-traumatic stress disorder, unspecified: Secondary | ICD-10-CM

## 2017-01-10 DIAGNOSIS — F422 Mixed obsessional thoughts and acts: Secondary | ICD-10-CM

## 2017-02-10 ENCOUNTER — Encounter (HOSPITAL_COMMUNITY): Payer: Self-pay | Admitting: Psychiatry

## 2017-02-10 ENCOUNTER — Ambulatory Visit (INDEPENDENT_AMBULATORY_CARE_PROVIDER_SITE_OTHER): Payer: PRIVATE HEALTH INSURANCE | Admitting: Psychiatry

## 2017-02-10 DIAGNOSIS — F4310 Post-traumatic stress disorder, unspecified: Secondary | ICD-10-CM | POA: Diagnosis not present

## 2017-02-10 DIAGNOSIS — F411 Generalized anxiety disorder: Secondary | ICD-10-CM

## 2017-02-10 DIAGNOSIS — Z811 Family history of alcohol abuse and dependence: Secondary | ICD-10-CM

## 2017-02-10 DIAGNOSIS — F422 Mixed obsessional thoughts and acts: Secondary | ICD-10-CM

## 2017-02-10 DIAGNOSIS — F4010 Social phobia, unspecified: Secondary | ICD-10-CM | POA: Diagnosis not present

## 2017-02-10 DIAGNOSIS — Z818 Family history of other mental and behavioral disorders: Secondary | ICD-10-CM | POA: Diagnosis not present

## 2017-02-10 DIAGNOSIS — F332 Major depressive disorder, recurrent severe without psychotic features: Secondary | ICD-10-CM | POA: Diagnosis not present

## 2017-02-10 MED ORDER — BUPROPION HCL ER (XL) 150 MG PO TB24: ORAL_TABLET | ORAL | 0 refills | Status: DC

## 2017-02-10 MED ORDER — ARIPIPRAZOLE 15 MG PO TABS: 15.0000 mg | ORAL_TABLET | Freq: Every day | ORAL | 0 refills | Status: DC

## 2017-02-10 MED ORDER — CLONAZEPAM 0.5 MG PO TABS: 0.5000 mg | ORAL_TABLET | Freq: Every day | ORAL | 0 refills | Status: DC | PRN

## 2017-02-10 NOTE — Progress Notes (Signed)
BH MD/PA/NP OP Progress Note  02/10/2017 11:02 AM Jo Haas  MRN:  [id number]  Chief Complaint:  Chief Complaint    Follow-up      HPI: Pt states anxiety continues unchanged. She has been thinking about death a lot. She feels like everything in going to kill her- like in a car accident. She tries to talk herself down when it happens. She has racing thoughts constantly.   Sleep is good. Energy and appetite are average.  OCD is not an issue this past year.  Social anxiety is bad. She no longer feels comfortable asking her mom for things like refilling meds. States she doesn't want to trouble her mother. Pt always feels she is bothering her mother. Pt has a driving permit and getting her driver's license in a couple of weeks.  Depression comes and goes a couple of times a week. It will last for 1 day. On that days she is unmotivated, she has anhedonia and spends her time isolated in bed. Pt denies SI/SIB/HI.  Taking meds as prescribed and denies SE. She states she has more energy and is no longer having mood swings but doesn't think the meds are helping more than that.   Visit Diagnosis:    ICD-10-CM   1. GAD (generalized anxiety disorder) F41.1 ARIPiprazole (ABILIFY) 15 MG tablet    clonazePAM (KLONOPIN) 0.5 MG tablet  2. Severe episode of recurrent major depressive disorder, without psychotic features (HCC) F33.2 ARIPiprazole (ABILIFY) 15 MG tablet    buPROPion (WELLBUTRIN XL) 150 MG 24 hr tablet  3. PTSD (post-traumatic stress disorder) F43.10 ARIPiprazole (ABILIFY) 15 MG tablet  4. Social anxiety disorder F40.10 ARIPiprazole (ABILIFY) 15 MG tablet    clonazePAM (KLONOPIN) 0.5 MG tablet  5. Mixed obsessional thoughts and acts F42.2 ARIPiprazole (ABILIFY) 15 MG tablet      Past Psychiatric History:  Anxiety: Yes Bipolar Disorder: No Depression: Yes Mania: No Psychosis: No Schizophrenia: No Personality Disorder: No Hospitalization for psychiatric illness: No History  of Electroconvulsive Shock Therapy: No Prior Suicide Attempts: Yes- in 2015 she OD on her pain meds.  SIB: cutting in the past, none since November of 2015  Past Medical History:  Past Medical History:  Diagnosis Date  . Anxiety   . Depression   . Multiple body piercings    nose, ears  . PTSD (post-traumatic stress disorder)   . Vision abnormalities    corrective lenses    Past Surgical History:  Procedure Laterality Date  . LAPAROSCOPIC CHOLECYSTECTOMY SINGLE PORT N/A 03/04/2014   Procedure: LAPAROSCOPIC CHOLECYSTECTOMY SINGLE PORT;  Surgeon: Ardeth SportsmanSteven C. Gross, MD;  Location: [facility];  Service: General;  Laterality: N/A;    Family Psychiatric History:  Family History  Problem Relation Age of Onset  . Depression Mother   . Varicose Veins Mother   . Asthma Brother   . Alcohol abuse Father   . Anxiety disorder Father   . COPD Paternal Grandmother     Social History:  Social History   Social History  . Marital status: Single    Spouse name: N/A  . Number of children: N/A  . Years of education: N/A   Social History Main Topics  . Smoking status: Never Smoker  . Smokeless tobacco: Never Used  . Alcohol use No  . Drug use: No  . Sexual activity: No   Other Topics Concern  . Not on file   Social History Narrative  . No narrative on file    Allergies:  Allergies  Allergen Reactions  . Amoxicillin Swelling    .Marland KitchenHas patient had a PCN reaction causing immediate rash, facial/tongue/throat swelling, SOB or lightheadedness with hypotension: UNKNOWN Has patient had a PCN reaction causing severe rash involving mucus membranes or skin necrosis: UNKNOWN Has patient had a PCN reaction that required hospitalization UNKNOWN Has patient had a PCN reaction occurring within the last 10 years: Yes - infant allergy If all of the above answers are "NO", then may proceed with Cephalosporin use.   . Pollen Extract Swelling    Food containing pollen    Metabolic Disorder  Labs: Lab Results  Component Value Date   HGBA1C 4.9 06/03/2016   MPG 94 06/03/2016   Lab Results  Component Value Date   PROLACTIN 11.6 09/20/2016   Lab Results  Component Value Date   CHOL 161 06/03/2016   TRIG 96 06/03/2016   HDL 44 06/03/2016   CHOLHDL 3.7 06/03/2016   VLDL 19 06/03/2016   LDLCALC 98 06/03/2016     Current Medications: Current Outpatient Prescriptions  Medication Sig Dispense Refill  . ARIPiprazole (ABILIFY) 10 MG tablet TAKE 1 TABLET (10 MG TOTAL) BY MOUTH AT BEDTIME. FOR MOOD CONTROL 30 tablet 0  . buPROPion (WELLBUTRIN XL) 150 MG 24 hr tablet TAKE 1 TABLET BY MOUTH EVERY DAY FOR DEPRESSION 30 tablet 1  . cholecalciferol (VITAMIN D) 1000 units tablet Take 1 tablet (1,000 Units total) by mouth daily. For bone health 30 tablet 0  . drospirenone-ethinyl estradiol (YAZ,GIANVI,LORYNA) 3-0.02 MG tablet Take 1 tablet by mouth daily. 3 Package 4  . medroxyPROGESTERone (PROVERA) 5 MG tablet Take 1 tablet (5 mg total) by mouth daily. 5 tablet 0   No current facility-administered medications for this visit.       Musculoskeletal: Strength & Muscle Tone: within normal limits Gait & Station: normal Patient leans: N/A  Psychiatric Specialty Exam: Review of Systems  HENT: Negative for congestion, sinus pain and sore throat.   Respiratory: Negative for cough, shortness of breath and wheezing.   Neurological: Negative for dizziness, tremors and headaches.  Psychiatric/Behavioral: Positive for depression. Negative for hallucinations and substance abuse. The patient is nervous/anxious. The patient does not have insomnia.     Blood pressure 112/68, pulse 80, height 5\' 1"  (1.549 m), weight 128 lb 12.8 oz (58.4 kg).Body mass index is 24.34 kg/m.  General Appearance: Casual  Eye Contact:  Fair  Speech:  Clear and Coherent and Normal Rate  Volume:  Decreased  Mood:  Anxious and Depressed  Affect:  Congruent  Thought Process:  Goal Directed and Descriptions of  Associations: Intact  Orientation:  Full (Time, Place, and Person)  Thought Content: Logical   Suicidal Thoughts:  No  Homicidal Thoughts:  No  Memory:  Immediate;   Good Recent;   Good Remote;   Good  Judgement:  Fair  Insight:  Fair  Psychomotor Activity:  Increased  Concentration:  Concentration: Fair and Attention Span: Fair  Recall:  [employer] of Knowledge: Good  Language: Good  Akathisia:  No  Handed:  Right  AIMS (if indicated):  AIMS:  Facial and Oral Movements  Muscles of Facial Expression: None, normal  Lips and Perioral Area: None, normal  Jaw: None, normal  Tongue: None, normal Extremity Movements: Upper (arms, wrists, hands, fingers): None, normal  Lower (legs, knees, ankles, toes): None, normal,  Trunk Movements:  Neck, shoulders, hips: None, normal,  Overall Severity : Severity of abnormal movements (highest score from questions above): None, normal  Incapacitation  due to abnormal movements: None, normal  Patient's awareness of abnormal movements (rate only patient's report): No Awareness, Dental Status  Current problems with teeth and/or dentures?: No  Does patient usually wear dentures?: No     Assets:  Communication Skills Desire for Improvement Housing Social Support  ADL's:  Intact  Cognition: WNL  Sleep:  good     Treatment Plan Summary:Medication management  Assessment: GAD; MDD-recurrent, severe without psychotic features; PTSD; Social anxiety disorder; OCD   Medication management with supportive therapy. Risks/benefits and SE of the medication discussed. Pt verbalized understanding and verbal consent obtained for treatment.  Affirm with the patient that the medications are taken as ordered. Patient expressed understanding of how their medications were to be used.   The risk of un-intended pregnancy is low based on the fact that pt reports she is using daily OCP. Pt is aware that these meds carry a teratogenic risk. Pt will discuss plan of  action if she does or plans to become pregnant in the future.   Meds: increase Abilify 15mg  qD for depression, GAD, SAD and OCD Wellbutrin XL 150mg  po qD for depression Start trial of Klonopin 0.5mg  po qD prn anxiety   Labs:none  Therapy: brief supportive therapy provided. Discussed psychosocial stressors in detail.    Consultations: encouraged to follow up with therapist  Pt denies SI and is at an acute low risk for suicide. Patient told to call clinic if any problems occur. Patient advised to go to ER if they should develop SI/HI, side effects, or if symptoms worsen. Has crisis numbers to call if needed. Pt verbalized understanding.  F/up in 2 months or sooner if needed   Oletta Darter, MD 02/10/2017, 11:02 AM

## 2017-02-18 ENCOUNTER — Other Ambulatory Visit (HOSPITAL_COMMUNITY): Payer: Self-pay | Admitting: Psychiatry

## 2017-02-18 DIAGNOSIS — F422 Mixed obsessional thoughts and acts: Secondary | ICD-10-CM

## 2017-02-18 DIAGNOSIS — F332 Major depressive disorder, recurrent severe without psychotic features: Secondary | ICD-10-CM

## 2017-02-18 DIAGNOSIS — F4310 Post-traumatic stress disorder, unspecified: Secondary | ICD-10-CM

## 2017-02-18 DIAGNOSIS — F411 Generalized anxiety disorder: Secondary | ICD-10-CM

## 2017-02-18 DIAGNOSIS — F4010 Social phobia, unspecified: Secondary | ICD-10-CM

## 2017-04-14 ENCOUNTER — Ambulatory Visit (HOSPITAL_COMMUNITY): Payer: PRIVATE HEALTH INSURANCE | Admitting: Psychiatry

## 2017-05-07 ENCOUNTER — Other Ambulatory Visit (HOSPITAL_COMMUNITY): Payer: Self-pay | Admitting: Psychiatry

## 2017-05-07 DIAGNOSIS — F332 Major depressive disorder, recurrent severe without psychotic features: Secondary | ICD-10-CM

## 2017-05-12 ENCOUNTER — Ambulatory Visit (HOSPITAL_COMMUNITY): Payer: PRIVATE HEALTH INSURANCE | Admitting: Psychiatry

## 2017-06-13 ENCOUNTER — Other Ambulatory Visit (HOSPITAL_COMMUNITY): Payer: Self-pay | Admitting: Psychiatry

## 2017-06-13 DIAGNOSIS — F332 Major depressive disorder, recurrent severe without psychotic features: Secondary | ICD-10-CM

## 2017-06-15 ENCOUNTER — Other Ambulatory Visit (HOSPITAL_COMMUNITY): Payer: Self-pay

## 2017-06-15 DIAGNOSIS — F4310 Post-traumatic stress disorder, unspecified: Secondary | ICD-10-CM

## 2017-06-15 DIAGNOSIS — F422 Mixed obsessional thoughts and acts: Secondary | ICD-10-CM

## 2017-06-15 DIAGNOSIS — F411 Generalized anxiety disorder: Secondary | ICD-10-CM

## 2017-06-15 DIAGNOSIS — F4010 Social phobia, unspecified: Secondary | ICD-10-CM

## 2017-06-15 DIAGNOSIS — F332 Major depressive disorder, recurrent severe without psychotic features: Secondary | ICD-10-CM

## 2017-06-15 MED ORDER — ARIPIPRAZOLE 15 MG PO TABS: 15.0000 mg | ORAL_TABLET | Freq: Every day | ORAL | 0 refills | Status: DC

## 2017-06-15 MED ORDER — BUPROPION HCL ER (XL) 150 MG PO TB24: ORAL_TABLET | ORAL | 0 refills | Status: DC

## 2017-08-11 ENCOUNTER — Ambulatory Visit (HOSPITAL_COMMUNITY): Payer: Self-pay | Admitting: Psychiatry

## 2017-10-06 ENCOUNTER — Telehealth: Payer: Self-pay

## 2017-10-06 NOTE — Telephone Encounter (Signed)
Called patient to schedule AEX before we refill OCP. We received refill request from [facility] [facility]. Left message for patient to call me back.

## 2017-10-07 ENCOUNTER — Other Ambulatory Visit (HOSPITAL_COMMUNITY): Payer: Self-pay | Admitting: Psychiatry

## 2017-10-07 DIAGNOSIS — F4310 Post-traumatic stress disorder, unspecified: Secondary | ICD-10-CM

## 2017-10-07 DIAGNOSIS — F411 Generalized anxiety disorder: Secondary | ICD-10-CM

## 2017-10-07 DIAGNOSIS — F4010 Social phobia, unspecified: Secondary | ICD-10-CM

## 2017-10-07 DIAGNOSIS — F422 Mixed obsessional thoughts and acts: Secondary | ICD-10-CM

## 2017-10-07 DIAGNOSIS — F332 Major depressive disorder, recurrent severe without psychotic features: Secondary | ICD-10-CM

## 2017-10-12 NOTE — Telephone Encounter (Signed)
Patient never returned my call to make AEX appointment. Refused refill on OCP.

## 2017-10-20 ENCOUNTER — Other Ambulatory Visit: Payer: Self-pay

## 2017-10-20 ENCOUNTER — Encounter: Payer: Self-pay | Admitting: Certified Nurse Midwife

## 2017-10-20 ENCOUNTER — Ambulatory Visit (INDEPENDENT_AMBULATORY_CARE_PROVIDER_SITE_OTHER): Payer: BLUE CROSS/BLUE SHIELD | Admitting: Certified Nurse Midwife

## 2017-10-20 VITALS — BP 102/78 | HR 64 | Resp 16 | Ht 61.25 in | Wt 134.0 lb

## 2017-10-20 DIAGNOSIS — Z8659 Personal history of other mental and behavioral disorders: Secondary | ICD-10-CM | POA: Diagnosis not present

## 2017-10-20 DIAGNOSIS — Z3041 Encounter for surveillance of contraceptive pills: Secondary | ICD-10-CM

## 2017-10-20 DIAGNOSIS — Z01419 Encounter for gynecological examination (general) (routine) without abnormal findings: Secondary | ICD-10-CM | POA: Diagnosis not present

## 2017-10-20 MED ORDER — DROSPIRENONE-ETHINYL ESTRADIOL 3-0.02 MG PO TABS: 1.0000 | ORAL_TABLET | Freq: Every day | ORAL | 4 refills | Status: DC

## 2017-10-20 NOTE — Patient Instructions (Signed)
General topics  Next pap or exam is  due in 1 year Take a Women's multivitamin Take 1200 mg. of calcium daily - prefer dietary If any concerns in interim to call back  Breast Self-Awareness Practicing breast self-awareness may pick up problems early, prevent significant medical complications, and possibly save your life. By practicing breast self-awareness, you can become familiar with how your breasts look and feel and if your breasts are changing. This allows you to notice changes early. It can also offer you some reassurance that your breast health is good. One way to learn what is normal for your breasts and whether your breasts are changing is to do a breast self-exam. If you find a lump or something that was not present in the past, it is best to contact your caregiver right away. Other findings that should be evaluated by your caregiver include nipple discharge, especially if it is bloody; skin changes or reddening; areas where the skin seems to be pulled in (retracted); or new lumps and bumps. Breast pain is seldom associated with cancer (malignancy), but should also be evaluated by a caregiver. BREAST SELF-EXAM The best time to examine your breasts is 5 7 days after your menstrual period is over.  ExitCare Patient Information 2013 [employer].   Exercise to Stay Healthy Exercise helps you become and stay healthy. EXERCISE IDEAS AND TIPS Choose exercises that:  You enjoy.  Fit into your day. You do not need to exercise really hard to be healthy. You can do exercises at a slow or medium level and stay healthy. You can:  Stretch before and after working out.  Try yoga, Pilates, or tai chi.  Lift weights.  Walk fast, swim, jog, run, climb stairs, bicycle, dance, or rollerskate.  Take aerobic classes. Exercises that burn about 150 calories:  Running 1  miles in 15 minutes.  Playing volleyball for 45 to 60 minutes.  Washing and waxing a car for 45 to 60  minutes.  Playing touch football for 45 minutes.  Walking 1  miles in 35 minutes.  Pushing a stroller 1  miles in 30 minutes.  Playing basketball for 30 minutes.  Raking leaves for 30 minutes.  Bicycling 5 miles in 30 minutes.  Walking 2 miles in 30 minutes.  Dancing for 30 minutes.  Shoveling snow for 15 minutes.  Swimming laps for 20 minutes.  Walking up stairs for 15 minutes.  Bicycling 4 miles in 15 minutes.  Gardening for 30 to 45 minutes.  Jumping rope for 15 minutes.  Washing windows or floors for 45 to 60 minutes. Document Released: 08/14/2010 Document Revised: 10/04/2011 Document Reviewed: 08/14/2010 ExitCare Patient Information 2013 [employer].   Other topics ( that may be useful information):    Sexually Transmitted Disease Sexually transmitted disease (STD) refers to any infection that is passed from person to person during sexual activity. This may happen by way of saliva, semen, blood, vaginal mucus, or urine. Common STDs include:  Gonorrhea.  Chlamydia.  Syphilis.  HIV/AIDS.  Genital herpes.  Hepatitis B and C.  Trichomonas.  Human papillomavirus (HPV).  Pubic lice. CAUSES  An STD may be spread by bacteria, virus, or parasite. A person can get an STD by:  Sexual intercourse with an infected person.  Sharing sex toys with an infected person.  Sharing needles with an infected person.  Having intimate contact with the genitals, mouth, or rectal areas of an infected person. SYMPTOMS  Some people may not have any symptoms, but   they can still pass the infection to others. Different STDs have different symptoms. Symptoms include:  Painful or bloody urination.  Pain in the pelvis, abdomen, vagina, anus, throat, or eyes.  Skin rash, itching, irritation, growths, or sores (lesions). These usually occur in the genital or anal area.  Abnormal vaginal discharge.  Penile discharge in men.  Soft, flesh-colored skin growths in the  genital or anal area.  Fever.  Pain or bleeding during sexual intercourse.  Swollen glands in the groin area.  Yellow skin and eyes (jaundice). This is seen with hepatitis. DIAGNOSIS  To make a diagnosis, your caregiver may:  Take a medical history.  Perform a physical exam.  Take a specimen (culture) to be examined.  Examine a sample of discharge under a microscope.  Perform blood test TREATMENT   Chlamydia, gonorrhea, trichomonas, and syphilis can be cured with antibiotic medicine.  Genital herpes, hepatitis, and HIV can be treated, but not cured, with prescribed medicines. The medicines will lessen the symptoms.  Genital warts from HPV can be treated with medicine or by freezing, burning (electrocautery), or surgery. Warts may come back.  HPV is a virus and cannot be cured with medicine or surgery.However, abnormal areas may be followed very closely by your caregiver and may be removed from the cervix, vagina, or vulva through office procedures or surgery. If your diagnosis is confirmed, your recent sexual partners need treatment. This is true even if they are symptom-free or have a negative culture or evaluation. They should not have sex until their caregiver says it is okay. HOME CARE INSTRUCTIONS  All sexual partners should be informed, tested, and treated for all STDs.  Take your antibiotics as directed. Finish them even if you start to feel better.  Only take over-the-counter or prescription medicines for pain, discomfort, or fever as directed by your caregiver.  Rest.  Eat a balanced diet and drink enough fluids to keep your urine clear or pale yellow.  Do not have sex until treatment is completed and you have followed up with your caregiver. STDs should be checked after treatment.  Keep all follow-up appointments, Pap tests, and blood tests as directed by your caregiver.  Only use latex condoms and water-soluble lubricants during sexual activity. Do not use  petroleum jelly or oils.  Avoid alcohol and illegal drugs.  Get vaccinated for HPV and hepatitis. If you have not received these vaccines in the past, talk to your caregiver about whether one or both might be right for you.  Avoid risky sex practices that can break the skin. The only way to avoid getting an STD is to avoid all sexual activity.Latex condoms and dental dams (for oral sex) will help lessen the risk of getting an STD, but will not completely eliminate the risk. SEEK MEDICAL CARE IF:   You have a fever.  You have any new or worsening symptoms. Document Released: 10/02/2002 Document Revised: 10/04/2011 Document Reviewed: 10/09/2010 [facility] Patient Information 2013 [employer].    Domestic Abuse You are being battered or abused if someone close to you hits, pushes, or physically hurts you in any way. You also are being abused if you are forced into activities. You are being sexually abused if you are forced to have sexual contact of any kind. You are being emotionally abused if you are made to feel worthless or if you are constantly threatened. It is important to remember that help is available. No one has the right to abuse you. PREVENTION OF FURTHER  ABUSE  Learn the warning signs of danger. This varies with situations but may include: the use of alcohol, threats, isolation from friends and family, or forced sexual contact. Leave if you feel that violence is going to occur.  If you are attacked or beaten, report it to the police so the abuse is documented. You do not have to press charges. The police can protect you while you or the attackers are leaving. Get the officer's name and badge number and a copy of the report.  Find someone you can trust and tell them what is happening to you: your caregiver, a nurse, clergy member, close friend or family member. Feeling ashamed is natural, but remember that you have done nothing wrong. No one deserves abuse. Document Released:  07/09/2000 Document Revised: 10/04/2011 Document Reviewed: 09/17/2010 ExitCare Patient Information 2013 [employer]Care[employer].    How Much is Too Much Alcohol? Drinking too much alcohol can cause injury, accidents, and health problems. These types of problems can include:   Car crashes.  Falls.  Family fighting (domestic violence).  Drowning.  Fights.  Injuries.  Burns.  Damage to certain organs.  Having a baby with birth defects. ONE DRINK CAN BE TOO MUCH WHEN YOU ARE:  Working.  Pregnant or breastfeeding.  Taking medicines. Ask your doctor.  Driving or planning to drive. If you or someone you know has a drinking problem, get help from a doctor.  Document Released: 05/08/2009 Document Revised: 10/04/2011 Document Reviewed: 05/08/2009 ExitCare Patient Information 2013 [employer]Care[employer].   Smoking Hazards Smoking cigarettes is extremely bad for your health. Tobacco smoke has over 200 known poisons in it. There are over 60 chemicals in tobacco smoke that cause cancer. Some of the chemicals found in cigarette smoke include:   Cyanide.  Benzene.  Formaldehyde.  Methanol (wood alcohol).  Acetylene (fuel used in welding torches).  Ammonia. Cigarette smoke also contains the poisonous gases nitrogen oxide and carbon monoxide.  Cigarette smokers have an increased risk of many serious medical problems and Smoking causes approximately:  90% of all lung cancer deaths in men.  80% of all lung cancer deaths in women.  90% of deaths from chronic obstructive lung disease. Compared with nonsmokers, smoking increases the risk of:  Coronary heart disease by 2 to 4 times.  Stroke by 2 to 4 times.  Men developing lung cancer by 23 times.  Women developing lung cancer by 13 times.  Dying from chronic obstructive lung diseases by 12 times.  . Smoking is the most preventable cause of death and disease in our society.  WHY IS SMOKING ADDICTIVE?  Nicotine is the chemical  agent in tobacco that is capable of causing addiction or dependence.  When you smoke and inhale, nicotine is absorbed rapidly into the bloodstream through your lungs. Nicotine absorbed through the lungs is capable of creating a powerful addiction. Both inhaled and non-inhaled nicotine may be addictive.  Addiction studies of cigarettes and spit tobacco show that addiction to nicotine occurs mainly during the teen years, when young people begin using tobacco products. WHAT ARE THE BENEFITS OF QUITTING?  There are many health benefits to quitting smoking.   Likelihood of developing cancer and heart disease decreases. Health improvements are seen almost immediately.  Blood pressure, pulse rate, and breathing patterns start returning to normal soon after quitting. QUITTING SMOKING   American Lung Association - 1-800-LUNGUSA  American Cancer Society - 1-800-ACS-2345 Document Released: 08/19/2004 Document Revised: 10/04/2011 Document Reviewed: 04/23/2009 ExitCare Patient Information 2013 ExitCare[employer]   LLC.   Stress Management Stress is a state of physical or mental tension that often results from changes in your life or normal routine. Some common causes of stress are:  Death of a loved one.  Injuries or severe illnesses.  Getting fired or changing jobs.  Moving into a new home. Other causes may be:  Sexual problems.  Business or financial losses.  Taking on a large debt.  Regular conflict with someone at home or at work.  Constant tiredness from lack of sleep. It is not just bad things that are stressful. It may be stressful to:  Win the lottery.  Get married.  Buy a new car. The amount of stress that can be easily tolerated varies from person to person. Changes generally cause stress, regardless of the types of change. Too much stress can affect your health. It may lead to physical or emotional problems. Too little stress (boredom) may also become stressful. SUGGESTIONS TO  REDUCE STRESS:  Talk things over with your family and friends. It often is helpful to share your concerns and worries. If you feel your problem is serious, you may want to get help from a professional counselor.  Consider your problems one at a time instead of lumping them all together. Trying to take care of everything at once may seem impossible. List all the things you need to do and then start with the most important one. Set a goal to accomplish 2 or 3 things each day. If you expect to do too many in a single day you will naturally fail, causing you to feel even more stressed.  Do not use alcohol or drugs to relieve stress. Although you may feel better for a short time, they do not remove the problems that caused the stress. They can also be habit forming.  Exercise regularly - at least 3 times per week. Physical exercise can help to relieve that "uptight" feeling and will relax you.  The shortest distance between despair and hope is often a good night's sleep.  Go to bed and get up on time allowing yourself time for appointments without being rushed.  Take a short "time-out" period from any stressful situation that occurs during the day. Close your eyes and take some deep breaths. Starting with the muscles in your face, tense them, hold it for a few seconds, then relax. Repeat this with the muscles in your neck, shoulders, hand, stomach, back and legs.  Take good care of yourself. Eat a balanced diet and get plenty of rest.  Schedule time for having fun. Take a break from your daily routine to relax. HOME CARE INSTRUCTIONS   Call if you feel overwhelmed by your problems and feel you can no longer manage them on your own.  Return immediately if you feel like hurting yourself or someone else. Document Released: 01/05/2001 Document Revised: 10/04/2011 Document Reviewed: 08/28/2007 ExitCare Patient Information 2013 ExitCare, [employer].   

## 2017-10-20 NOTE — Progress Notes (Signed)
22 y.o. G0P0000 Single  Caucasian Fe here for annual exam. Periods are normal on OCP's now, no long episodes without periods. Day 2 heavy only and then light until completed on day 5. 1-2  Occurrence of missed pills and took on same day. Uses condoms consist also. No partner change or STD screening needed. Uses Urgent care if needed. Was treated for UTI x 1 in last year. Psychiatric medications stable with MD treatment. No alcohol use. Sees Urgent care if needed. 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 No other health issues today.  Patient's last menstrual period was 10/15/2017 (exact date).          Sexually active: Yes.    The current method of family planning is OCP (estrogen/progesterone).    Exercising: No.  exercise Smoker:  no  Health Maintenance: Pap:  none History of Abnormal Pap: no MMG:  none Self Breast exams: occ Colonoscopy:  none BMD:   none TDaP:  2012 Shingles: no Pneumonia: no Hep C and HIV: 08-27-15 neg Labs: no    reports that she has never smoked. She has never used smokeless tobacco. She reports that she does not drink alcohol or use drugs.  Past Medical History:  Diagnosis Date  . Anxiety   . Depression   . Multiple body piercings    nose, ears  . PTSD (post-traumatic stress disorder)   . Vision abnormalities    corrective lenses    Past Surgical History:  Procedure Laterality Date  . LAPAROSCOPIC CHOLECYSTECTOMY SINGLE PORT N/A 03/04/2014   Procedure: LAPAROSCOPIC CHOLECYSTECTOMY SINGLE PORT;  Surgeon: Ardeth Sportsman, MD;  Location: [facility];  Service: General;  Laterality: N/A;    Current Outpatient Medications  Medication Sig Dispense Refill  . ARIPiprazole (ABILIFY) 15 MG tablet Take 1 tablet (15 mg total) by mouth daily. For mood control 90 tablet 0  . buPROPion (WELLBUTRIN XL) 150 MG 24 hr tablet TAKE 1 TABLET BY MOUTH EVERY DAY FOR DEPRESSION 90 tablet 0  . clonazePAM (KLONOPIN) 0.5 MG tablet Take 1 tablet (0.5 mg total) by mouth daily as needed  for anxiety. 90 tablet 0  . drospirenone-ethinyl estradiol (YAZ,GIANVI,LORYNA) 3-0.02 MG tablet Take 1 tablet by mouth daily. 3 Package 4   No current facility-administered medications for this visit.     Family History  Problem Relation Age of Onset  . Depression Mother   . Varicose Veins Mother   . Asthma Brother   . Alcohol abuse Father   . Anxiety disorder Father   . COPD Paternal Grandmother     ROS:  Pertinent items are noted in HPI.  Otherwise, a comprehensive ROS was negative.  Exam:   BP 102/78   Pulse 64   Resp 16   Ht 5' 1.25" (1.556 m)   Wt 134 lb (60.8 kg)   LMP 10/15/2017 (Exact Date)   BMI 25.11 kg/m  Height: 5' 1.25" (155.6 cm) Ht Readings from Last 3 Encounters:  10/20/17 5' 1.25" (1.556 m)  09/20/16 5' 0.75" (1.543 m) (8 %, Z= -1.39)*  08/27/15 5' 1.25" (1.556 m) (12 %, Z= -1.18)*   * Growth percentiles are based on CDC (Girls, 2-20 Years) data.    General appearance: alert, cooperative and appears stated age Head: Normocephalic, without obvious abnormality, atraumatic Neck: no adenopathy, supple, symmetrical, trachea midline and thyroid normal to inspection and palpation Lungs: clear to auscultation bilaterally Breasts: normal appearance, no masses or tenderness, No nipple retraction or dimpling, No nipple discharge or bleeding, No  axillary or supraclavicular adenopathy, Taught monthly breast self examination Heart: regular rate and rhythm Abdomen: soft, non-tender; no masses,  no organomegaly Extremities: extremities normal, atraumatic, no cyanosis or edema Skin: Skin color, texture, turgor normal. No rashes or lesions Lymph nodes: Cervical, supraclavicular, and axillary nodes normal. No abnormal inguinal nodes palpated Neurologic: Grossly normal   Pelvic: External genitalia:  no lesions              Urethra:  normal appearing urethra with no masses, tenderness or lesions              Bartholin's and Skene's: normal                 Vagina:  normal appearing vagina with normal color and discharge, no lesions              Cervix: no cervical motion tenderness, no lesions and nulliparous appearance              Pap taken: No. Bimanual Exam:  Uterus:  normal size, contour, position, consistency, mobility, non-tender and anteverted              Adnexa: normal adnexa and no mass, fullness, tenderness               Rectovaginal: Confirms               Anus:  normal appearance  Chaperone present: yes  A:  Well Woman with normal exam  Contraception OCP desired  History of OCD, PTSD,depression with Psychiatrist management of medications, all stable at present per patient  P:   Reviewed health and wellness pertinent to exam  Discussed risks/benefits/warning signs of OCP.   Rx Yaz see order with instructions  Continue follow up with MD as indicated  Pap smear: no  counseled on breast self exam, STD prevention, HIV risk factors and prevention, use and side effects of OCP's, adequate intake of calcium and vitamin D, diet and exercise  return annually or prn  An After Visit Summary was printed and given to the patient.

## 2017-10-21 ENCOUNTER — Other Ambulatory Visit (HOSPITAL_COMMUNITY): Payer: Self-pay | Admitting: Psychiatry

## 2017-10-21 DIAGNOSIS — F332 Major depressive disorder, recurrent severe without psychotic features: Secondary | ICD-10-CM

## 2017-10-25 ENCOUNTER — Other Ambulatory Visit (HOSPITAL_COMMUNITY): Payer: Self-pay | Admitting: Psychiatry

## 2017-10-25 DIAGNOSIS — F411 Generalized anxiety disorder: Secondary | ICD-10-CM

## 2017-10-25 DIAGNOSIS — F4310 Post-traumatic stress disorder, unspecified: Secondary | ICD-10-CM

## 2017-10-25 DIAGNOSIS — F4010 Social phobia, unspecified: Secondary | ICD-10-CM

## 2017-10-25 DIAGNOSIS — F332 Major depressive disorder, recurrent severe without psychotic features: Secondary | ICD-10-CM

## 2017-10-25 DIAGNOSIS — F422 Mixed obsessional thoughts and acts: Secondary | ICD-10-CM

## 2017-10-26 ENCOUNTER — Other Ambulatory Visit (HOSPITAL_COMMUNITY): Payer: Self-pay

## 2017-10-26 DIAGNOSIS — F411 Generalized anxiety disorder: Secondary | ICD-10-CM

## 2017-10-26 DIAGNOSIS — F4010 Social phobia, unspecified: Secondary | ICD-10-CM

## 2017-10-26 DIAGNOSIS — F332 Major depressive disorder, recurrent severe without psychotic features: Secondary | ICD-10-CM

## 2017-10-26 DIAGNOSIS — F4310 Post-traumatic stress disorder, unspecified: Secondary | ICD-10-CM

## 2017-10-26 DIAGNOSIS — F422 Mixed obsessional thoughts and acts: Secondary | ICD-10-CM

## 2017-10-26 MED ORDER — ARIPIPRAZOLE 15 MG PO TABS: 15.0000 mg | ORAL_TABLET | Freq: Every day | ORAL | 0 refills | Status: DC

## 2017-10-26 MED ORDER — BUPROPION HCL ER (XL) 150 MG PO TB24: ORAL_TABLET | ORAL | 0 refills | Status: DC

## 2017-11-24 ENCOUNTER — Ambulatory Visit (INDEPENDENT_AMBULATORY_CARE_PROVIDER_SITE_OTHER): Payer: BLUE CROSS/BLUE SHIELD | Admitting: Psychiatry

## 2017-11-24 ENCOUNTER — Other Ambulatory Visit: Payer: Self-pay

## 2017-11-24 ENCOUNTER — Encounter (HOSPITAL_COMMUNITY): Payer: Self-pay | Admitting: Psychiatry

## 2017-11-24 DIAGNOSIS — F4310 Post-traumatic stress disorder, unspecified: Secondary | ICD-10-CM | POA: Diagnosis not present

## 2017-11-24 DIAGNOSIS — F422 Mixed obsessional thoughts and acts: Secondary | ICD-10-CM

## 2017-11-24 DIAGNOSIS — F411 Generalized anxiety disorder: Secondary | ICD-10-CM

## 2017-11-24 DIAGNOSIS — F332 Major depressive disorder, recurrent severe without psychotic features: Secondary | ICD-10-CM | POA: Diagnosis not present

## 2017-11-24 DIAGNOSIS — F4010 Social phobia, unspecified: Secondary | ICD-10-CM

## 2017-11-24 DIAGNOSIS — Z811 Family history of alcohol abuse and dependence: Secondary | ICD-10-CM

## 2017-11-24 DIAGNOSIS — Z818 Family history of other mental and behavioral disorders: Secondary | ICD-10-CM

## 2017-11-24 DIAGNOSIS — Z79899 Other long term (current) drug therapy: Secondary | ICD-10-CM

## 2017-11-24 MED ORDER — ARIPIPRAZOLE 15 MG PO TABS: 15.0000 mg | ORAL_TABLET | Freq: Every day | ORAL | 1 refills | Status: DC

## 2017-11-24 MED ORDER — PAROXETINE HCL 20 MG PO TABS: 20.0000 mg | ORAL_TABLET | Freq: Every day | ORAL | 1 refills | Status: DC

## 2017-11-24 MED ORDER — BUPROPION HCL ER (XL) 150 MG PO TB24: ORAL_TABLET | ORAL | 1 refills | Status: DC

## 2017-11-24 NOTE — Progress Notes (Signed)
BH MD/PA/NP OP Progress Note  11/24/2017 2:30 PM Jo Haas  MRN:  [id number]  Chief Complaint:  Chief Complaint    Follow-up     HPI:  Pt was last seen July 2018.  Anxiety is worse. Pt is unable to watch any crime dramas as it makes her worry she will be murdered. She does not liek to drive as she worries about being in an MVA and dying. Pt is able to go to school and interact with friends. She has 7 friends. Pt is easily overwhelmed but has been able to push herself to do well this semester. It takes a lot of effort on her part. It has been a problem in school work. Previously Klonopin made her tired but not calm her down. She the last time she took it was several months ago. Pt continues to take Wellbutrin and Abilify at night and it helps her depression stable but not her anxiety. She has trouble motivating herself to do school work, getting out of bed to go to class (but has been going this semester). She denies anhedonia. Pt reports some hopelessness about getting better and functioning well one day. Pt is sleeping well and getting about 10 hrs/night. Pt denies SI/HI. Pt still intrusive memories but it is better since being away from her dad.   Pt denies SE from Abilify and Wellbutrin.    Visit Diagnosis:    ICD-10-CM   1. GAD (generalized anxiety disorder) F41.1 ARIPiprazole (ABILIFY) 15 MG tablet    PARoxetine (PAXIL) 20 MG tablet  2. Severe episode of recurrent major depressive disorder, without psychotic features (HCC) F33.2 ARIPiprazole (ABILIFY) 15 MG tablet    buPROPion (WELLBUTRIN XL) 150 MG 24 hr tablet    PARoxetine (PAXIL) 20 MG tablet  3. PTSD (post-traumatic stress disorder) F43.10 ARIPiprazole (ABILIFY) 15 MG tablet    PARoxetine (PAXIL) 20 MG tablet  4. Social anxiety disorder F40.10 ARIPiprazole (ABILIFY) 15 MG tablet    PARoxetine (PAXIL) 20 MG tablet  5. Mixed obsessional thoughts and acts F42.2 ARIPiprazole (ABILIFY) 15 MG tablet      Past Psychiatric  History:  Anxiety: Yes Bipolar Disorder: No Depression: Yes Mania: No Psychosis: No Schizophrenia: No Personality Disorder: No Hospitalization for psychiatric illness: No History of Electroconvulsive Shock Therapy: No Prior Suicide Attempts: Yes- in 2015 she OD on her pain meds.  SIB: cutting in the past, none since November of 2015  Previous meds- Prozac helps with anxiety but makes depression worse and caused vivid dreams; Effexor- not effective and caused a lot fatigue; Lexapro-tried but was overmedicated   Past Medical History:  Past Medical History:  Diagnosis Date  . Anxiety   . Depression   . Multiple body piercings    nose, ears  . PTSD (post-traumatic stress disorder)   . Vision abnormalities    corrective lenses    Past Surgical History:  Procedure Laterality Date  . LAPAROSCOPIC CHOLECYSTECTOMY SINGLE PORT N/A 03/04/2014   Procedure: LAPAROSCOPIC CHOLECYSTECTOMY SINGLE PORT;  Surgeon: Ardeth Sportsman, MD;  Location: [facility];  Service: General;  Laterality: N/A;    Family Psychiatric History: Family History  Problem Relation Age of Onset  . Depression Mother   . Varicose Veins Mother   . Asthma Brother   . Alcohol abuse Father   . Anxiety disorder Father   . COPD Paternal Grandmother     Social History:  Social History   Socioeconomic History  . Marital status: Single    Spouse name: Not  on file  . Number of children: Not on file  . Years of education: Not on file  . Highest education level: Not on file  Occupational History  . Not on file  Social Needs  . Financial resource strain: Not on file  . Food insecurity:    Worry: Not on file    Inability: Not on file  . Transportation needs:    Medical: Not on file    Non-medical: Not on file  Tobacco Use  . Smoking status: Never Smoker  . Smokeless tobacco: Never Used  Substance and Sexual Activity  . Alcohol use: No  . Drug use: No  . Sexual activity: Yes    Partners: Female, Female    Birth  control/protection: Pill    Comment: female partner now  Lifestyle  . Physical activity:    Days per week: Not on file    Minutes per session: Not on file  . Stress: Not on file  Relationships  . Social connections:    Talks on phone: Not on file    Gets together: Not on file    Attends religious service: Not on file    Active member of club or organization: Not on file    Attends meetings of clubs or organizations: Not on file    Relationship status: Not on file  Other Topics Concern  . Not on file  Social History Narrative  . Not on file    Allergies:  Allergies  Allergen Reactions  . Amoxicillin Swelling    .Marland KitchenHas patient had a PCN reaction causing immediate rash, facial/tongue/throat swelling, SOB or lightheadedness with hypotension: UNKNOWN Has patient had a PCN reaction causing severe rash involving mucus membranes or skin necrosis: UNKNOWN Has patient had a PCN reaction that required hospitalization UNKNOWN Has patient had a PCN reaction occurring within the last 10 years: Yes - infant allergy If all of the above answers are "NO", then may proceed with Cephalosporin use.   . Pollen Extract Swelling    Food containing pollen    Metabolic Disorder Labs: Lab Results  Component Value Date   HGBA1C 4.9 06/03/2016   MPG 94 06/03/2016   Lab Results  Component Value Date   PROLACTIN 11.6 09/20/2016   Lab Results  Component Value Date   CHOL 161 06/03/2016   TRIG 96 06/03/2016   HDL 44 06/03/2016   CHOLHDL 3.7 06/03/2016   VLDL 19 06/03/2016   LDLCALC 98 06/03/2016   Lab Results  Component Value Date   TSH 1.09 09/20/2016   TSH 1.255 06/02/2016    Therapeutic Level Labs: No results found for: LITHIUM No results found for: VALPROATE No components found for:  CBMZ  Current Medications: Current Outpatient Medications  Medication Sig Dispense Refill  . ARIPiprazole (ABILIFY) 15 MG tablet Take 1 tablet (15 mg total) by mouth daily. For mood control 30 tablet  1  . buPROPion (WELLBUTRIN XL) 150 MG 24 hr tablet TAKE 1 TABLET BY MOUTH EVERY DAY FOR DEPRESSION 30 tablet 1  . drospirenone-ethinyl estradiol (YAZ,GIANVI,LORYNA) 3-0.02 MG tablet Take 1 tablet by mouth daily. 3 Package 4  . PARoxetine (PAXIL) 20 MG tablet Take 1 tablet (20 mg total) by mouth daily. 30 tablet 1   No current facility-administered medications for this visit.      Musculoskeletal: Strength & Muscle Tone: within normal limits Gait & Station: normal Patient leans: N/A  Psychiatric Specialty Exam: Review of Systems  Constitutional: Negative for chills, diaphoresis and fever.  Neurological:  Negative for dizziness, tingling, tremors, sensory change and headaches.    Blood pressure 100/68, pulse 100, temperature 98.5 F (36.9 C), temperature source Oral, resp. rate 16, weight 132 lb 9.6 oz (60.1 kg), SpO2 98 %.Body mass index is 24.85 kg/m.  General Appearance: Casual  Eye Contact:  Good  Speech:  Clear and Coherent and Normal Rate  Volume:  Normal  Mood:  Anxious and Depressed  Affect:  Congruent  Thought Process:  Goal Directed and Descriptions of Associations: Intact  Orientation:  Full (Time, Place, and Person)  Thought Content: Logical   Suicidal Thoughts:  No  Homicidal Thoughts:  No  Memory:  Immediate;   Good Recent;   Good Remote;   Good  Judgement:  Good  Insight:  Good  Psychomotor Activity:  Restlessness  Concentration:  Concentration: Good and Attention Span: Good  Recall:  Good  Fund of Knowledge: Good  Language: Good  Akathisia:  No  Handed:  Right  AIMS (if indicated): not done  Assets:  Desire for Improvement Housing Transportation Vocational/Educational  ADL's:  Intact  Cognition: WNL  Sleep:  Good   Screenings: AIMS     Ad[facility] (Discharged) [facility]  AIMS Total Score  0    AUDIT     [facility] Admission (Discharged) from [facility]  Alcohol Use Disorder Identification Test Final Score (AUDIT)  0  0    MDI     [facility]  Total Score (max 50)  36      I reviewed the information below on 11/24/2017 and agree except where noted/changed Assessment and Plan: GAD; MDD-recurrent, severe without psychotic features; PTSD; Social anxiety disorder; OCD    Medication management with supportive therapy. Risks/benefits and SE of the medication discussed. Pt verbalized understanding and verbal consent obtained for treatment.  Affirm with the patient that the medications are taken as ordered. Patient expressed understanding of how their medications were to be used.    The risk of un-intended pregnancy is low based on the fact that pt reports she is using daily OCP. Pt is aware that these meds carry a teratogenic risk. Pt will discuss plan of action if she does or plans to become pregnant in the future.     Meds:  Abilify  qD for depression, GAD, SAD and OCD Wellbutrin XL  po qD for depression Start trial of Paxil  po qD for MDD and GAD and SAD and PTSD     Labs: none   Therapy: brief supportive therapy provided. Discussed psychosocial stressors in detail.     Consultations: declined therapy referral   Pt denies SI and is at an acute low risk for suicide. Patient told to call clinic if any problems occur. Patient advised to go to ER if they should develop SI/HI, side effects, or if symptoms worsen. Has crisis numbers to call if needed. Pt verbalized understanding.   F/up in 2 months or sooner if needed    Oletta Darter, MD 11/24/2017, 2:30 PM

## 2017-12-06 ENCOUNTER — Telehealth (HOSPITAL_COMMUNITY): Payer: Self-pay

## 2017-12-06 NOTE — Telephone Encounter (Signed)
Patient is calling because she said the Paxil is making her sleepy, she is sleeping up to 12 hours a day. Please review and advise, thank you

## 2017-12-08 NOTE — Telephone Encounter (Signed)
I called the patient and advised her what Dr. Michae Kava said, patient is agreeable to this plan.

## 2017-12-08 NOTE — Telephone Encounter (Signed)
Tell her to cut the dose in half and try for 1 week. If improved then increase dose back to  qD

## 2018-01-28 ENCOUNTER — Other Ambulatory Visit (HOSPITAL_COMMUNITY): Payer: Self-pay | Admitting: Psychiatry

## 2018-01-28 DIAGNOSIS — F4310 Post-traumatic stress disorder, unspecified: Secondary | ICD-10-CM

## 2018-01-28 DIAGNOSIS — F411 Generalized anxiety disorder: Secondary | ICD-10-CM

## 2018-01-28 DIAGNOSIS — F4010 Social phobia, unspecified: Secondary | ICD-10-CM

## 2018-01-28 DIAGNOSIS — F332 Major depressive disorder, recurrent severe without psychotic features: Secondary | ICD-10-CM

## 2018-01-28 DIAGNOSIS — F422 Mixed obsessional thoughts and acts: Secondary | ICD-10-CM

## 2018-02-02 ENCOUNTER — Encounter (HOSPITAL_COMMUNITY): Payer: Self-pay | Admitting: Psychiatry

## 2018-02-02 ENCOUNTER — Ambulatory Visit (INDEPENDENT_AMBULATORY_CARE_PROVIDER_SITE_OTHER): Payer: BLUE CROSS/BLUE SHIELD | Admitting: Psychiatry

## 2018-02-02 DIAGNOSIS — F4010 Social phobia, unspecified: Secondary | ICD-10-CM | POA: Diagnosis not present

## 2018-02-02 DIAGNOSIS — F4310 Post-traumatic stress disorder, unspecified: Secondary | ICD-10-CM | POA: Diagnosis not present

## 2018-02-02 DIAGNOSIS — F411 Generalized anxiety disorder: Secondary | ICD-10-CM

## 2018-02-02 DIAGNOSIS — F422 Mixed obsessional thoughts and acts: Secondary | ICD-10-CM

## 2018-02-02 DIAGNOSIS — F332 Major depressive disorder, recurrent severe without psychotic features: Secondary | ICD-10-CM | POA: Diagnosis not present

## 2018-02-02 MED ORDER — ARIPIPRAZOLE 15 MG PO TABS: 15.0000 mg | ORAL_TABLET | Freq: Every day | ORAL | 1 refills | Status: DC

## 2018-02-02 MED ORDER — LAMOTRIGINE 25 MG PO TABS: ORAL_TABLET | ORAL | 1 refills | Status: DC

## 2018-02-02 MED ORDER — BUPROPION HCL ER (XL) 150 MG PO TB24: ORAL_TABLET | ORAL | 1 refills | Status: DC

## 2018-02-02 NOTE — Progress Notes (Signed)
[facility] MD/PA/NP OP Progress Note  02/02/2018 3:06 PM Jo Haas  MRN:  [id number]  Chief Complaint:  Chief Complaint    Anxiety; Follow-up     HPI: Pt stopped the Paxil about 2 weeks ago because it was causing her to feel jittery and dilated pupils (no vision change per pt).   Pt is always tired and is sleeping a lot. Pt is sleeping 10+ hrs a day. She is procrastinating but is able to complete her school work on time. Her anxiety is unchanged- she is not able to tolerate much stress. She is very worried about having a car accident and dying or causing someone else to die.  Depression is the same. It is very hard to motivate herself to do anything but Wellbutrin helps some. Pt denies isolation and anhedonia. Pt feels supported by her friends. Pt denies SI/HI. Abilify helps to keep her mood stable- she is no longer going from being extremely irritable to suicidal like before. She is tolerating both without SE.    Visit Diagnosis:    ICD-10-CM   1. Severe episode of recurrent major depressive disorder, without psychotic features (HCC) F33.2 buPROPion (WELLBUTRIN XL) 150 MG 24 hr tablet    ARIPiprazole (ABILIFY) 15 MG tablet    lamoTRIgine (LAMICTAL) 25 MG tablet  2. GAD (generalized anxiety disorder) F41.1 ARIPiprazole (ABILIFY) 15 MG tablet  3. PTSD (post-traumatic stress disorder) F43.10 ARIPiprazole (ABILIFY) 15 MG tablet  4. Social anxiety disorder F40.10 ARIPiprazole (ABILIFY) 15 MG tablet  5. Mixed obsessional thoughts and acts F42.2 ARIPiprazole (ABILIFY) 15 MG tablet    Past Psychiatric History:  Anxiety: Yes Bipolar Disorder: No Depression: Yes Mania: No Psychosis: No Schizophrenia: No Personality Disorder: No Hospitalization for psychiatric illness: No History of Electroconvulsive Shock Therapy: No Prior Suicide Attempts: Yes- in 2015 she OD on her pain meds.  SIB: cutting in the past, none since November of 2015  Previous meds- Prozac helps with anxiety but makes  depression worse and caused vivid dreams; Effexor- not effective and caused a lot fatigue; Lexapro-tried but was overmedicated, Paxil- caused her feel jittery; Remeron- made her tired   Past Medical History:  Past Medical History:  Diagnosis Date  . Anxiety   . Depression   . Multiple body piercings    nose, ears  . PTSD (post-traumatic stress disorder)   . Vision abnormalities    corrective lenses    Past Surgical History:  Procedure Laterality Date  . LAPAROSCOPIC CHOLECYSTECTOMY SINGLE PORT N/A 03/04/2014   Procedure: LAPAROSCOPIC CHOLECYSTECTOMY SINGLE PORT;  Surgeon: Ardeth Sportsman, MD;  Location: [facility];  Service: General;  Laterality: N/A;    Family Psychiatric History:  Family History  Problem Relation Age of Onset  . Depression Mother   . Varicose Veins Mother   . Asthma Brother   . Alcohol abuse Father   . Anxiety disorder Father   . COPD Paternal Grandmother     Social History:  Social History   Socioeconomic History  . Marital status: Single    Spouse name: Not on file  . Number of children: Not on file  . Years of education: Not on file  . Highest education level: Not on file  Occupational History  . Not on file  Social Needs  . Financial resource strain: Not on file  . Food insecurity:    Worry: Not on file    Inability: Not on file  . Transportation needs:    Medical: Not on file  Non-medical: Not on file  Tobacco Use  . Smoking status: Never Smoker  . Smokeless tobacco: Never Used  Substance and Sexual Activity  . Alcohol use: No  . Drug use: No  . Sexual activity: Yes    Partners: Female, Female    Birth control/protection: Pill    Comment: female partner now  Lifestyle  . Physical activity:    Days per week: Not on file    Minutes per session: Not on file  . Stress: Not on file  Relationships  . Social connections:    Talks on phone: Not on file    Gets together: Not on file    Attends religious service: Not on file    Active  member of club or organization: Not on file    Attends meetings of clubs or organizations: Not on file    Relationship status: Not on file  Other Topics Concern  . Not on file  Social History Narrative  . Not on file    Allergies:  Allergies  Allergen Reactions  . Amoxicillin Swelling    .Marland Kitchen.Has patient had a PCN reaction causing immediate rash, facial/tongue/throat swelling, SOB or lightheadedness with hypotension: UNKNOWN Has patient had a PCN reaction causing severe rash involving mucus membranes or skin necrosis: UNKNOWN Has patient had a PCN reaction that required hospitalization UNKNOWN Has patient had a PCN reaction occurring within the last 10 years: Yes - infant allergy If all of the above answers are "NO", then may proceed with Cephalosporin use.   . Pollen Extract Swelling    Food containing pollen    Metabolic Disorder Labs: Lab Results  Component Value Date   HGBA1C 4.9 06/03/2016   MPG 94 06/03/2016   Lab Results  Component Value Date   PROLACTIN 11.6 09/20/2016   Lab Results  Component Value Date   CHOL 161 06/03/2016   TRIG 96 06/03/2016   HDL 44 06/03/2016   CHOLHDL 3.7 06/03/2016   VLDL 19 06/03/2016   LDLCALC 98 06/03/2016   Lab Results  Component Value Date   TSH 1.09 09/20/2016   TSH 1.255 06/02/2016    Therapeutic Level Labs: No results found for: LITHIUM No results found for: VALPROATE No components found for:  CBMZ  Current Medications: Current Outpatient Medications  Medication Sig Dispense Refill  . ARIPiprazole (ABILIFY) 15 MG tablet Take 1 tablet (15 mg total) by mouth daily. For mood control 30 tablet 1  . buPROPion (WELLBUTRIN XL) 150 MG 24 hr tablet TAKE 1 TABLET BY MOUTH EVERY DAY FOR DEPRESSION 30 tablet 1  . drospirenone-ethinyl estradiol (YAZ,GIANVI,LORYNA) 3-0.02 MG tablet Take 1 tablet by mouth daily. 3 Package 4  . lamoTRIgine (LAMICTAL) 25 MG tablet Take 1 tabs x10 days then increase to 2 tabs (50mg ) qHS 70 tablet 1    No current facility-administered medications for this visit.      Musculoskeletal: Strength & Muscle Tone: within normal limits Gait & Station: normal Patient leans: N/A  Psychiatric Specialty Exam: Review of Systems  Constitutional: Negative for chills, diaphoresis and fever.  Psychiatric/Behavioral: Positive for depression. Negative for hallucinations, substance abuse and suicidal ideas. The patient is nervous/anxious. The patient does not have insomnia.     Blood pressure 110/66, height 5\' 1"  (1.549 m), weight 126 lb (57.2 kg).Body mass index is 23.81 kg/m.  General Appearance: Casual  Eye Contact:  Minimal  Speech:  Clear and Coherent and Normal Rate  Volume:  Decreased  Mood:  Anxious and Depressed  Affect:  Congruent  Thought Process:  Goal Directed and Descriptions of Associations: Intact  Orientation:  Full (Time, Place, and Person)  Thought Content: Logical   Suicidal Thoughts:  No  Homicidal Thoughts:  No  Memory:  Immediate;   Good Recent;   Good Remote;   Good  Judgement:  Good  Insight:  Good  Psychomotor Activity:  Restlessness  Concentration:  Concentration: Good and Attention Span: Good  Recall:  Good  Fund of Knowledge: Good  Language: Good  Akathisia:  No  Handed:  Right  AIMS (if indicated): not done  Assets:  Communication Skills Desire for Improvement Housing Social Support Transportation Vocational/Educational  ADL's:  Intact  Cognition: WNL  Sleep:  Good   Screenings: AIMS     Admission (Discharged) [facility]  AIMS Total Score  0    AUDIT     [facility] Admission (Discharged) from [facility]  Alcohol Use Disorder Identification Test Final Score (AUDIT)  0  0    MDI     [facility]  Total Score (max 50)  36       Assessment and Plan: GAD; MDD-recurrent, severe without psychotic features; PTSD; Social anxiety disorder; OCD    Medication management with supportive therapy. Risks/benefits and SE of the medication discussed. Pt verbalized understanding and verbal consent obtained for treatment.  Affirm with the patient that the medications are taken as ordered. Patient expressed understanding of how their medications were to be used.    The risk of un-intended pregnancy is low based on the fact that pt reports she is using daily OCP. Pt is aware that these meds carry a teratogenic risk. Pt will discuss plan of action if she does or plans to become pregnant in the future.     Meds:  Abilify 15mg  qD for depression, GAD, SAD and OCD Wellbutrin XL 150mg  po qD for depression D/c Paxil Start trial of Lamictal 25mg  x 10 days then increase to 50mg  po qHS for MDD     Labs: none   Therapy: brief supportive therapy provided. Discussed psychosocial stressors in detail.     Consultations: declined therapy referral   Pt denies SI and is at an acute low risk for suicide. Patient told to call clinic if any problems occur. Patient advised to go to ER if they should develop SI/HI, side effects, or if symptoms worsen. Has crisis numbers to call if needed. Pt verbalized understanding.   F/up in 6 weeks or sooner if needed      Oletta Darter, MD 02/02/2018, 3:06 PM

## 2018-02-27 ENCOUNTER — Other Ambulatory Visit (HOSPITAL_COMMUNITY): Payer: Self-pay | Admitting: Psychiatry

## 2018-02-27 DIAGNOSIS — F332 Major depressive disorder, recurrent severe without psychotic features: Secondary | ICD-10-CM

## 2018-03-16 ENCOUNTER — Encounter (HOSPITAL_COMMUNITY): Payer: Self-pay | Admitting: Psychiatry

## 2018-03-16 ENCOUNTER — Ambulatory Visit (INDEPENDENT_AMBULATORY_CARE_PROVIDER_SITE_OTHER): Payer: BLUE CROSS/BLUE SHIELD | Admitting: Psychiatry

## 2018-03-16 DIAGNOSIS — F422 Mixed obsessional thoughts and acts: Secondary | ICD-10-CM

## 2018-03-16 DIAGNOSIS — F4310 Post-traumatic stress disorder, unspecified: Secondary | ICD-10-CM | POA: Diagnosis not present

## 2018-03-16 DIAGNOSIS — F411 Generalized anxiety disorder: Secondary | ICD-10-CM | POA: Diagnosis not present

## 2018-03-16 DIAGNOSIS — F4010 Social phobia, unspecified: Secondary | ICD-10-CM | POA: Diagnosis not present

## 2018-03-16 DIAGNOSIS — F332 Major depressive disorder, recurrent severe without psychotic features: Secondary | ICD-10-CM

## 2018-03-16 MED ORDER — ARIPIPRAZOLE 15 MG PO TABS: 15.0000 mg | ORAL_TABLET | Freq: Every day | ORAL | 1 refills | Status: DC

## 2018-03-16 MED ORDER — BUSPIRONE HCL 5 MG PO TABS: 5.0000 mg | ORAL_TABLET | Freq: Two times a day (BID) | ORAL | 1 refills | Status: DC

## 2018-03-16 MED ORDER — LAMOTRIGINE 25 MG PO TABS: ORAL_TABLET | ORAL | 1 refills | Status: DC

## 2018-03-16 MED ORDER — BUPROPION HCL ER (XL) 150 MG PO TB24: ORAL_TABLET | ORAL | 1 refills | Status: DC

## 2018-03-16 NOTE — Progress Notes (Signed)
BH MD/PA/NP OP Progress Note  03/16/2018 1:31 PM Jo Haas  MRN:  [id number]  Chief Complaint:  Chief Complaint    Depression; Follow-up     HPI: "It's been kinda hard to tell. I have been depressed for other situations". Pt states she went to visit her boyfriend for 2 weeks and now misses him. Pt feels happy and good when with him but is upset that she doesn't know when she will see him next. He lives in [place].  She hopes that at some point in time in the near future she can go and spend one month there.  She states will not be a problem to spend a long time with him as her classes are all online.  pt is now depressed since coming back about 10 days ago. She has low motivation and is having trouble getting her school work done. She feels overwhelmed when she thinks about her school work. Her personal hygiene is poor. Pt is randomly crying. Pt is not isolating and enjoys time with her friends. Sleep is fair. Pt denies SI/HI. Pt denies irritability since using Abilify regularly. Jo Haas has not been keeping track of her symptoms so she does not know if the Lamictal is helping. Wellbutrin helps her depression but makes her anxiety about getting hurt worse.   Visit Diagnosis:    ICD-10-CM   1. Severe episode of recurrent major depressive disorder, without psychotic features (HCC) F33.2 buPROPion (WELLBUTRIN XL) 150 MG 24 hr tablet    ARIPiprazole (ABILIFY) 15 MG tablet    lamoTRIgine (LAMICTAL) 25 MG tablet  2. GAD (generalized anxiety disorder) F41.1 ARIPiprazole (ABILIFY) 15 MG tablet    busPIRone (BUSPAR) 5 MG tablet  3. PTSD (post-traumatic stress disorder) F43.10 ARIPiprazole (ABILIFY) 15 MG tablet  4. Social anxiety disorder F40.10 ARIPiprazole (ABILIFY) 15 MG tablet  5. Mixed obsessional thoughts and acts F42.2 ARIPiprazole (ABILIFY) 15 MG tablet      Past Psychiatric History:  Anxiety: Yes Bipolar Disorder: No Depression: Yes Mania: No Psychosis: No Schizophrenia:  No Personality Disorder: No Hospitalization for psychiatric illness: No History of Electroconvulsive Shock Therapy: No Prior Suicide Attempts: Yes- in 2015 she OD on her pain meds.  SIB: cutting in the past, none since November of 2015  Previous meds- Prozac helps with anxiety but makes depression worse and caused vivid dreams; Effexor- not effective and caused a lot fatigue; Lexapro-tried but was overmedicated, Paxil- caused her feel jittery; Remeron- made her tired   Past Medical History:  Past Medical History:  Diagnosis Date  . Anxiety   . Depression   . Multiple body piercings    nose, ears  . PTSD (post-traumatic stress disorder)   . Vision abnormalities    corrective lenses    Past Surgical History:  Procedure Laterality Date  . LAPAROSCOPIC CHOLECYSTECTOMY SINGLE PORT N/A 03/04/2014   Procedure: LAPAROSCOPIC CHOLECYSTECTOMY SINGLE PORT;  Surgeon: Ardeth Sportsman, MD;  Location: [facility];  Service: General;  Laterality: N/A;    Family Psychiatric History:  Family History  Problem Relation Age of Onset  . Depression Mother   . Varicose Veins Mother   . Asthma Brother   . Alcohol abuse Father   . Anxiety disorder Father   . COPD Paternal Grandmother     Social History:  Social History   Socioeconomic History  . Marital status: Single    Spouse name: Not on file  . Number of children: Not on file  . Years of education: Not on file  .  Highest education level: Not on file  Occupational History  . Not on file  Social Needs  . Financial resource strain: Not on file  . Food insecurity:    Worry: Not on file    Inability: Not on file  . Transportation needs:    Medical: Not on file    Non-medical: Not on file  Tobacco Use  . Smoking status: Never Smoker  . Smokeless tobacco: Never Used  Substance and Sexual Activity  . Alcohol use: No  . Drug use: No  . Sexual activity: Yes    Partners: Female, Female    Birth control/protection: Pill    Comment: female partner  now  Lifestyle  . Physical activity:    Days per week: Not on file    Minutes per session: Not on file  . Stress: Not on file  Relationships  . Social connections:    Talks on phone: Not on file    Gets together: Not on file    Attends religious service: Not on file    Active member of club or organization: Not on file    Attends meetings of clubs or organizations: Not on file    Relationship status: Not on file  Other Topics Concern  . Not on file  Social History Narrative  . Not on file    Allergies:  Allergies  Allergen Reactions  . Amoxicillin Swelling    .Marland Kitchen.Has patient had a PCN reaction causing immediate rash, facial/tongue/throat swelling, SOB or lightheadedness with hypotension: UNKNOWN Has patient had a PCN reaction causing severe rash involving mucus membranes or skin necrosis: UNKNOWN Has patient had a PCN reaction that required hospitalization UNKNOWN Has patient had a PCN reaction occurring within the last 10 years: Yes - infant allergy If all of the above answers are "NO", then may proceed with Cephalosporin use.   . Pollen Extract Swelling    Food containing pollen    Metabolic Disorder Labs: Lab Results  Component Value Date   HGBA1C 4.9 06/03/2016   MPG 94 06/03/2016   Lab Results  Component Value Date   PROLACTIN 11.6 09/20/2016   Lab Results  Component Value Date   CHOL 161 06/03/2016   TRIG 96 06/03/2016   HDL 44 06/03/2016   CHOLHDL 3.7 06/03/2016   VLDL 19 06/03/2016   LDLCALC 98 06/03/2016   Lab Results  Component Value Date   TSH 1.09 09/20/2016   TSH 1.255 06/02/2016    Therapeutic Level Labs: No results found for: LITHIUM No results found for: VALPROATE No components found for:  CBMZ  Current Medications: Current Outpatient Medications  Medication Sig Dispense Refill  . ARIPiprazole (ABILIFY) 15 MG tablet Take 1 tablet (15 mg total) by mouth daily. For mood control 30 tablet 1  . buPROPion (WELLBUTRIN XL) 150 MG 24 hr tablet  TAKE 1 TABLET BY MOUTH EVERY DAY FOR DEPRESSION 30 tablet 1  . drospirenone-ethinyl estradiol (YAZ,GIANVI,LORYNA) 3-0.02 MG tablet Take 1 tablet by mouth daily. 3 Package 4  . lamoTRIgine (LAMICTAL) 25 MG tablet Take 2 tabs (50mg ) qHS 60 tablet 1  . busPIRone (BUSPAR) 5 MG tablet Take 1 tablet (5 mg total) by mouth 2 (two) times daily. 60 tablet 1   No current facility-administered medications for this visit.      Musculoskeletal: Strength & Muscle Tone: within normal limits Gait & Station: normal Patient leans: N/A  Psychiatric Specialty Exam: Review of Systems  Constitutional: Negative for chills, diaphoresis and fever.  HENT: Negative for  congestion, ear pain, sinus pain and sore throat.     Blood pressure 102/64, pulse 90, height 5\' 1"  (1.549 m), weight 132 lb (59.9 kg).Body mass index is 24.94 kg/m.  General Appearance: Disheveled  Eye Contact:  Good  Speech:  Clear and Coherent and Normal Rate  Volume:  Normal  Mood:  Depressed  Affect:  Congruent  Thought Process:  Goal Directed and Descriptions of Associations: Intact  Orientation:  Full (Time, Place, and Person)  Thought Content:  Logical  Suicidal Thoughts:  No  Homicidal Thoughts:  No  Memory:  Immediate;   Good Recent;   Good Remote;   Good  Judgement:  Fair  Insight:  Shallow  Psychomotor Activity:  Normal  Concentration:  Concentration: Fair and Attention Span: Fair  Recall:  Good  Fund of Knowledge:  Good  Language:  Good  Akathisia:  No  Handed:  Right  AIMS (if indicated):     Assets:  Communication Skills Desire for Improvement Housing Intimacy Physical Health Social Support Vocational/Educational  ADL's:  Intact  Cognition:  WNL  Sleep:   fair     Screenings: AIMS     Ad[facility] (Discharged) [facility]  AIMS Total Score  0    AUDIT     [facility] (Discharged[facility]  Admission (Discharged) from [facility]  Alcohol Use Disorder Identification Test Final Score (AUDIT)  0  0    MDI     [facility] 10/26/2011 [facility]  Total Score (max 50)  36      I reviewed the information below on 03/16/2018 and have updated it Assessment and Plan: GAD; MDD-recurrent, severe without psychotic features; PTSD; Social anxiety disorder; OCD    Medication management with supportive therapy. Risks/benefits and SE of the medication discussed. Pt verbalized understanding and verbal consent obtained for treatment.  Affirm with the patient that the medications are taken as ordered. Patient expressed understanding of how their medications were to be used.    The risk of un-intended pregnancy is low based on the fact that pt reports she is using daily OCP. Pt is aware that these meds carry a teratogenic risk. Pt will discuss plan of action if she does or plans to become pregnant in the future.     Meds:  Abilify 15mg  qD for depression, GAD, SAD and OCD Wellbutrin XL 150mg  po qD for depression  Lamictal 50mg  po qHS for MDD Start trial of Buspar 5mg  po BID for GAD     Labs: none   Therapy: brief supportive therapy provided. Discussed psychosocial stressors in detail.     Consultations: declined therapy referral   Pt denies SI and is at an acute low risk for suicide. Patient told to call clinic if any problems occur. Patient advised to go to ER if they should develop SI/HI, side effects, or if symptoms worsen. Has crisis numbers to call if needed. Pt verbalized understanding.   F/up in 6 weeks or sooner if needed      Oletta Darter, MD 03/16/2018, 1:31 PM

## 2018-04-10 ENCOUNTER — Other Ambulatory Visit (HOSPITAL_COMMUNITY): Payer: Self-pay | Admitting: Psychiatry

## 2018-04-10 DIAGNOSIS — F332 Major depressive disorder, recurrent severe without psychotic features: Secondary | ICD-10-CM

## 2018-04-10 DIAGNOSIS — F411 Generalized anxiety disorder: Secondary | ICD-10-CM

## 2018-04-27 ENCOUNTER — Other Ambulatory Visit: Payer: Self-pay

## 2018-04-27 ENCOUNTER — Ambulatory Visit (INDEPENDENT_AMBULATORY_CARE_PROVIDER_SITE_OTHER): Payer: BLUE CROSS/BLUE SHIELD | Admitting: Psychiatry

## 2018-04-27 ENCOUNTER — Encounter (HOSPITAL_COMMUNITY): Payer: Self-pay | Admitting: Psychiatry

## 2018-04-27 DIAGNOSIS — F411 Generalized anxiety disorder: Secondary | ICD-10-CM

## 2018-04-27 DIAGNOSIS — F332 Major depressive disorder, recurrent severe without psychotic features: Secondary | ICD-10-CM | POA: Diagnosis not present

## 2018-04-27 DIAGNOSIS — F422 Mixed obsessional thoughts and acts: Secondary | ICD-10-CM

## 2018-04-27 DIAGNOSIS — F4310 Post-traumatic stress disorder, unspecified: Secondary | ICD-10-CM

## 2018-04-27 DIAGNOSIS — F4010 Social phobia, unspecified: Secondary | ICD-10-CM

## 2018-04-27 MED ORDER — LAMOTRIGINE 25 MG PO TABS: ORAL_TABLET | ORAL | 1 refills | Status: DC

## 2018-04-27 MED ORDER — ARIPIPRAZOLE 15 MG PO TABS: 15.0000 mg | ORAL_TABLET | Freq: Every day | ORAL | 1 refills | Status: DC

## 2018-04-27 MED ORDER — BUPROPION HCL ER (XL) 150 MG PO TB24: ORAL_TABLET | ORAL | 1 refills | Status: DC

## 2018-04-27 MED ORDER — BUSPIRONE HCL 10 MG PO TABS: 10.0000 mg | ORAL_TABLET | Freq: Two times a day (BID) | ORAL | 1 refills | Status: DC
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
## 2018-05-09 ENCOUNTER — Other Ambulatory Visit (HOSPITAL_COMMUNITY): Payer: Self-pay | Admitting: Psychiatry

## 2018-05-09 DIAGNOSIS — F411 Generalized anxiety disorder: Secondary | ICD-10-CM

## 2018-05-22 ENCOUNTER — Other Ambulatory Visit (HOSPITAL_COMMUNITY): Payer: Self-pay | Admitting: Psychiatry

## 2018-05-22 DIAGNOSIS — F411 Generalized anxiety disorder: Secondary | ICD-10-CM

## 2018-05-22 DIAGNOSIS — F332 Major depressive disorder, recurrent severe without psychotic features: Secondary | ICD-10-CM

## 2018-05-27 ENCOUNTER — Other Ambulatory Visit (HOSPITAL_COMMUNITY): Payer: Self-pay | Admitting: Psychiatry

## 2018-05-27 DIAGNOSIS — F332 Major depressive disorder, recurrent severe without psychotic features: Secondary | ICD-10-CM

## 2018-05-27 DIAGNOSIS — F4010 Social phobia, unspecified: Secondary | ICD-10-CM

## 2018-05-27 DIAGNOSIS — F422 Mixed obsessional thoughts and acts: Secondary | ICD-10-CM

## 2018-05-27 DIAGNOSIS — F411 Generalized anxiety disorder: Secondary | ICD-10-CM

## 2018-05-27 DIAGNOSIS — F4310 Post-traumatic stress disorder, unspecified: Secondary | ICD-10-CM

## 2018-06-02 ENCOUNTER — Other Ambulatory Visit (HOSPITAL_COMMUNITY): Payer: Self-pay

## 2018-06-02 DIAGNOSIS — F411 Generalized anxiety disorder: Secondary | ICD-10-CM

## 2018-06-02 DIAGNOSIS — F4310 Post-traumatic stress disorder, unspecified: Secondary | ICD-10-CM

## 2018-06-02 DIAGNOSIS — F422 Mixed obsessional thoughts and acts: Secondary | ICD-10-CM

## 2018-06-02 DIAGNOSIS — F332 Major depressive disorder, recurrent severe without psychotic features: Secondary | ICD-10-CM

## 2018-06-02 DIAGNOSIS — F4010 Social phobia, unspecified: Secondary | ICD-10-CM

## 2018-06-29 ENCOUNTER — Ambulatory Visit (HOSPITAL_COMMUNITY): Payer: BLUE CROSS/BLUE SHIELD | Admitting: Psychiatry

## 2018-07-30 ENCOUNTER — Other Ambulatory Visit (HOSPITAL_COMMUNITY): Payer: Self-pay | Admitting: Psychiatry

## 2018-07-30 DIAGNOSIS — F422 Mixed obsessional thoughts and acts: Secondary | ICD-10-CM

## 2018-07-30 DIAGNOSIS — F4310 Post-traumatic stress disorder, unspecified: Secondary | ICD-10-CM

## 2018-07-30 DIAGNOSIS — F411 Generalized anxiety disorder: Secondary | ICD-10-CM

## 2018-07-30 DIAGNOSIS — F332 Major depressive disorder, recurrent severe without psychotic features: Secondary | ICD-10-CM

## 2018-07-30 DIAGNOSIS — F4010 Social phobia, unspecified: Secondary | ICD-10-CM

## 2018-08-02 ENCOUNTER — Other Ambulatory Visit (HOSPITAL_COMMUNITY): Payer: Self-pay

## 2018-08-02 DIAGNOSIS — F4010 Social phobia, unspecified: Secondary | ICD-10-CM

## 2018-08-02 DIAGNOSIS — F422 Mixed obsessional thoughts and acts: Secondary | ICD-10-CM

## 2018-08-02 DIAGNOSIS — F4310 Post-traumatic stress disorder, unspecified: Secondary | ICD-10-CM

## 2018-08-02 DIAGNOSIS — F332 Major depressive disorder, recurrent severe without psychotic features: Secondary | ICD-10-CM

## 2018-08-02 DIAGNOSIS — F411 Generalized anxiety disorder: Secondary | ICD-10-CM

## 2018-08-02 MED ORDER — BUSPIRONE HCL 10 MG PO TABS: 10.0000 mg | ORAL_TABLET | Freq: Two times a day (BID) | ORAL | 0 refills | Status: AC

## 2018-08-02 MED ORDER — BUPROPION HCL ER (XL) 150 MG PO TB24: ORAL_TABLET | ORAL | 0 refills | Status: AC

## 2018-08-02 MED ORDER — LAMOTRIGINE 25 MG PO TABS: ORAL_TABLET | ORAL | 0 refills | Status: AC

## 2018-08-02 MED ORDER — ARIPIPRAZOLE 15 MG PO TABS: 15.0000 mg | ORAL_TABLET | Freq: Every day | ORAL | 0 refills | Status: AC

## 2018-08-02 NOTE — Progress Notes (Unsigned)
Refill request from pharmacy in [facility], this is the second fill in [place]. I told the pharmacist that this would be the last fill, I told the patient previously that she needed to find a provider in [place].

## 2018-08-16 ENCOUNTER — Other Ambulatory Visit (HOSPITAL_COMMUNITY): Payer: Self-pay | Admitting: Psychiatry

## 2018-08-16 DIAGNOSIS — F411 Generalized anxiety disorder: Secondary | ICD-10-CM

## 2018-08-16 DIAGNOSIS — F4010 Social phobia, unspecified: Secondary | ICD-10-CM

## 2018-08-16 DIAGNOSIS — F422 Mixed obsessional thoughts and acts: Secondary | ICD-10-CM

## 2018-08-16 DIAGNOSIS — F4310 Post-traumatic stress disorder, unspecified: Secondary | ICD-10-CM

## 2018-08-16 DIAGNOSIS — F332 Major depressive disorder, recurrent severe without psychotic features: Secondary | ICD-10-CM

## 2018-08-17 ENCOUNTER — Other Ambulatory Visit (HOSPITAL_COMMUNITY): Payer: Self-pay | Admitting: Psychiatry

## 2018-08-17 DIAGNOSIS — F4010 Social phobia, unspecified: Secondary | ICD-10-CM

## 2018-08-17 DIAGNOSIS — F332 Major depressive disorder, recurrent severe without psychotic features: Secondary | ICD-10-CM

## 2018-08-17 DIAGNOSIS — F411 Generalized anxiety disorder: Secondary | ICD-10-CM

## 2018-08-17 DIAGNOSIS — F422 Mixed obsessional thoughts and acts: Secondary | ICD-10-CM

## 2018-08-17 DIAGNOSIS — F4310 Post-traumatic stress disorder, unspecified: Secondary | ICD-10-CM

## 2018-08-21 ENCOUNTER — Telehealth (HOSPITAL_COMMUNITY): Payer: Self-pay

## 2018-08-21 NOTE — Telephone Encounter (Signed)
Pharmacy called requesting a 90 day supply of Abilify. Please review

## 2018-08-22 NOTE — Telephone Encounter (Signed)
Deny it, patient cancelled last appointment and does not have one scheduled.

## 2018-08-23 NOTE — Telephone Encounter (Signed)
Called the pharmacy and left a voicemail message denying the request

## 2018-08-31 ENCOUNTER — Other Ambulatory Visit: Payer: Self-pay | Admitting: Certified Nurse Midwife

## 2018-08-31 DIAGNOSIS — Z3041 Encounter for surveillance of contraceptive pills: Secondary | ICD-10-CM

## 2018-08-31 NOTE — Telephone Encounter (Signed)
Medication refill request: OCP  Last AEX:  10/20/17 DL Next AEX: none scheduled Last MMG (if hormonal medication request): n/a  Refill authorized: 10/20/17 #3 w/4 refills; today please advise; will call patient to schedule AEX

## 2018-10-04 ENCOUNTER — Other Ambulatory Visit: Payer: Self-pay | Admitting: Certified Nurse Midwife

## 2018-10-04 DIAGNOSIS — Z3041 Encounter for surveillance of contraceptive pills: Secondary | ICD-10-CM

## 2019-10-17 ENCOUNTER — Encounter: Payer: Self-pay | Admitting: Certified Nurse Midwife
